# Patient Record
Sex: Female | Born: 1950 | Race: White | Hispanic: No | Marital: Married | Smoking: Former smoker
Health system: Southern US, Community
[De-identification: ages and names within clinical notes are randomized; demographics above are authoritative.]

## PROBLEM LIST (undated history)

## (undated) DIAGNOSIS — R1310 Dysphagia, unspecified: Secondary | ICD-10-CM

## (undated) DIAGNOSIS — I10 Essential (primary) hypertension: Secondary | ICD-10-CM

## (undated) DIAGNOSIS — E119 Type 2 diabetes mellitus without complications: Secondary | ICD-10-CM

## (undated) DIAGNOSIS — C3490 Malignant neoplasm of unspecified part of unspecified bronchus or lung: Secondary | ICD-10-CM

## (undated) DIAGNOSIS — G43909 Migraine, unspecified, not intractable, without status migrainosus: Secondary | ICD-10-CM

## (undated) DIAGNOSIS — E7800 Pure hypercholesterolemia, unspecified: Secondary | ICD-10-CM

## (undated) DIAGNOSIS — C801 Malignant (primary) neoplasm, unspecified: Secondary | ICD-10-CM

## (undated) DIAGNOSIS — I219 Acute myocardial infarction, unspecified: Secondary | ICD-10-CM

## (undated) HISTORY — PX: LUNG REMOVAL, PARTIAL: SHX233

## (undated) HISTORY — DX: Malignant neoplasm of unspecified part of unspecified bronchus or lung: C34.90

## (undated) HISTORY — PX: THORACOTOMY: SUR1349

## (undated) HISTORY — DX: Migraine, unspecified, not intractable, without status migrainosus: G43.909

## (undated) HISTORY — DX: Essential (primary) hypertension: I10

## (undated) HISTORY — DX: Dysphagia, unspecified: R13.10

## (undated) HISTORY — DX: Pure hypercholesterolemia, unspecified: E78.00

## (undated) HISTORY — DX: Acute myocardial infarction, unspecified: I21.9

---

## 1999-01-31 ENCOUNTER — Inpatient Hospital Stay (HOSPITAL_COMMUNITY): Admission: EM | Admit: 1999-01-31 | Discharge: 1999-02-01 | Payer: Self-pay | Admitting: Emergency Medicine

## 1999-01-31 ENCOUNTER — Encounter: Payer: Self-pay | Admitting: *Deleted

## 2000-09-18 ENCOUNTER — Ambulatory Visit (HOSPITAL_COMMUNITY): Admission: RE | Admit: 2000-09-18 | Discharge: 2000-09-18 | Payer: Self-pay | Admitting: *Deleted

## 2000-09-18 ENCOUNTER — Encounter: Payer: Self-pay | Admitting: *Deleted

## 2006-05-08 ENCOUNTER — Other Ambulatory Visit: Payer: Self-pay

## 2006-05-08 ENCOUNTER — Inpatient Hospital Stay: Payer: Self-pay | Admitting: Internal Medicine

## 2006-05-20 ENCOUNTER — Ambulatory Visit: Payer: Self-pay | Admitting: Unknown Physician Specialty

## 2006-06-30 ENCOUNTER — Ambulatory Visit: Payer: Self-pay | Admitting: Unknown Physician Specialty

## 2006-07-16 ENCOUNTER — Ambulatory Visit: Payer: Self-pay

## 2008-10-05 ENCOUNTER — Emergency Department: Payer: Self-pay | Admitting: Emergency Medicine

## 2009-02-14 ENCOUNTER — Ambulatory Visit: Payer: Self-pay | Admitting: Unknown Physician Specialty

## 2009-02-22 ENCOUNTER — Ambulatory Visit: Payer: Self-pay | Admitting: Unknown Physician Specialty

## 2009-05-29 ENCOUNTER — Ambulatory Visit: Payer: Self-pay | Admitting: Gastroenterology

## 2010-03-26 ENCOUNTER — Ambulatory Visit: Payer: Self-pay | Admitting: Unknown Physician Specialty

## 2010-05-01 ENCOUNTER — Ambulatory Visit: Payer: Self-pay | Admitting: Gastroenterology

## 2010-05-08 ENCOUNTER — Ambulatory Visit: Payer: Self-pay | Admitting: Sports Medicine

## 2010-09-01 ENCOUNTER — Ambulatory Visit: Payer: Self-pay | Admitting: Internal Medicine

## 2010-09-09 ENCOUNTER — Ambulatory Visit: Payer: Self-pay | Admitting: Unknown Physician Specialty

## 2010-09-16 ENCOUNTER — Ambulatory Visit: Payer: Self-pay | Admitting: Unknown Physician Specialty

## 2010-09-17 ENCOUNTER — Ambulatory Visit: Payer: Self-pay | Admitting: Internal Medicine

## 2010-09-20 ENCOUNTER — Ambulatory Visit: Payer: Self-pay | Admitting: Oncology

## 2010-09-25 ENCOUNTER — Ambulatory Visit: Payer: Self-pay | Admitting: Cardiothoracic Surgery

## 2010-10-02 ENCOUNTER — Ambulatory Visit: Payer: Self-pay | Admitting: Internal Medicine

## 2010-10-23 ENCOUNTER — Ambulatory Visit: Payer: Self-pay

## 2010-10-30 ENCOUNTER — Inpatient Hospital Stay: Payer: Self-pay | Admitting: Specialist

## 2010-11-02 ENCOUNTER — Ambulatory Visit: Payer: Self-pay | Admitting: Internal Medicine

## 2010-11-08 LAB — PATHOLOGY REPORT

## 2010-12-04 DIAGNOSIS — R072 Precordial pain: Secondary | ICD-10-CM

## 2010-12-19 ENCOUNTER — Ambulatory Visit: Payer: Self-pay | Admitting: Internal Medicine

## 2010-12-19 ENCOUNTER — Ambulatory Visit: Payer: Self-pay | Admitting: Oncology

## 2011-01-02 ENCOUNTER — Ambulatory Visit: Payer: Self-pay | Admitting: Internal Medicine

## 2011-02-01 ENCOUNTER — Ambulatory Visit: Payer: Self-pay | Admitting: Internal Medicine

## 2011-03-03 ENCOUNTER — Encounter: Payer: Self-pay | Admitting: Cardiothoracic Surgery

## 2011-03-04 ENCOUNTER — Ambulatory Visit: Payer: Self-pay | Admitting: Internal Medicine

## 2011-03-10 LAB — CBC CANCER CENTER
Basophil #: 0 x10 3/mm (ref 0.0–0.1)
Basophil %: 0.6 %
Eosinophil #: 0 x10 3/mm (ref 0.0–0.7)
Eosinophil %: 0.6 %
HCT: 39.5 % (ref 35.0–47.0)
HGB: 13.3 g/dL (ref 12.0–16.0)
Lymphocyte #: 2 x10 3/mm (ref 1.0–3.6)
Lymphocyte %: 62.9 %
MCH: 30.7 pg (ref 26.0–34.0)
MCHC: 33.6 g/dL (ref 32.0–36.0)
MCV: 92 fL (ref 80–100)
Monocyte #: 0.3 x10 3/mm (ref 0.0–0.7)
Monocyte %: 8 %
Neutrophil #: 0.9 x10 3/mm — ABNORMAL LOW (ref 1.4–6.5)
Neutrophil %: 27.9 %
Platelet: 122 x10 3/mm — ABNORMAL LOW (ref 150–440)
RBC: 4.31 10*6/uL (ref 3.80–5.20)
RDW: 17 % — ABNORMAL HIGH (ref 11.5–14.5)
WBC: 3.2 x10 3/mm — ABNORMAL LOW (ref 3.6–11.0)

## 2011-03-17 LAB — CBC CANCER CENTER
Basophil #: 0 x10 3/mm (ref 0.0–0.1)
Basophil %: 1 %
Eosinophil #: 0 x10 3/mm (ref 0.0–0.7)
Eosinophil %: 1.1 %
HCT: 34.4 % — ABNORMAL LOW (ref 35.0–47.0)
HGB: 11.8 g/dL — ABNORMAL LOW (ref 12.0–16.0)
Lymphocyte #: 1.9 x10 3/mm (ref 1.0–3.6)
Lymphocyte %: 46.3 %
MCH: 31.3 pg (ref 26.0–34.0)
MCHC: 34.2 g/dL (ref 32.0–36.0)
MCV: 92 fL (ref 80–100)
Monocyte #: 0.5 x10 3/mm (ref 0.0–0.7)
Monocyte %: 13.1 %
Neutrophil #: 1.6 x10 3/mm (ref 1.4–6.5)
Neutrophil %: 38.5 %
Platelet: 90 x10 3/mm — ABNORMAL LOW (ref 150–440)
RBC: 3.75 10*6/uL — ABNORMAL LOW (ref 3.80–5.20)
RDW: 17.8 % — ABNORMAL HIGH (ref 11.5–14.5)
WBC: 4.1 x10 3/mm (ref 3.6–11.0)

## 2011-03-24 LAB — COMPREHENSIVE METABOLIC PANEL
Albumin: 3.7 g/dL (ref 3.4–5.0)
Alkaline Phosphatase: 61 U/L (ref 50–136)
Anion Gap: 6 — ABNORMAL LOW (ref 7–16)
BUN: 8 mg/dL (ref 7–18)
Calcium, Total: 9.7 mg/dL (ref 8.5–10.1)
Chloride: 103 mmol/L (ref 98–107)
Creatinine: 0.66 mg/dL (ref 0.60–1.30)
EGFR (African American): >60
Glucose: 88 mg/dL (ref 65–99)
Osmolality: 273 (ref 275–301)
Potassium: 4.2 mmol/L (ref 3.5–5.1)
SGOT(AST): 17 U/L (ref 15–37)
Sodium: 138 mmol/L (ref 136–145)
Total Protein: 7.1 g/dL (ref 6.4–8.2)

## 2011-03-24 LAB — CBC CANCER CENTER
Basophil #: 0 x10 3/mm (ref 0.0–0.1)
Basophil %: 0.4 %
Eosinophil #: 0 x10 3/mm (ref 0.0–0.7)
Eosinophil %: 0.7 %
HCT: 35.4 % (ref 35.0–47.0)
Lymphocyte #: 1.8 x10 3/mm (ref 1.0–3.6)
MCH: 31.9 pg (ref 26.0–34.0)
MCHC: 34.4 g/dL (ref 32.0–36.0)
MCV: 93 fL (ref 80–100)
Monocyte #: 0.6 x10 3/mm (ref 0.0–0.7)
Neutrophil %: 31.5 %
Platelet: 222 x10 3/mm (ref 150–440)
RBC: 3.82 10*6/uL (ref 3.80–5.20)
RDW: 19.6 % — ABNORMAL HIGH (ref 11.5–14.5)
WBC: 3.6 x10 3/mm (ref 3.6–11.0)

## 2011-03-31 LAB — CBC CANCER CENTER
Basophil #: 0 x10 3/mm (ref 0.0–0.1)
Basophil %: 0.5 %
Eosinophil #: 0 x10 3/mm (ref 0.0–0.7)
HGB: 11.7 g/dL — ABNORMAL LOW (ref 12.0–16.0)
Lymphocyte %: 62 %
MCH: 31.8 pg (ref 26.0–34.0)
MCHC: 34.4 g/dL (ref 32.0–36.0)
Monocyte #: 0.3 x10 3/mm (ref 0.0–0.7)
Monocyte %: 10.2 %
Neutrophil %: 26.8 %
Platelet: 165 x10 3/mm (ref 150–440)
RBC: 3.68 10*6/uL — ABNORMAL LOW (ref 3.80–5.20)
WBC: 2.8 x10 3/mm — ABNORMAL LOW (ref 3.6–11.0)

## 2011-04-04 ENCOUNTER — Ambulatory Visit: Payer: Self-pay | Admitting: Internal Medicine

## 2011-04-07 LAB — CBC CANCER CENTER
Basophil #: 0 x10 3/mm (ref 0.0–0.1)
Eosinophil #: 0 x10 3/mm (ref 0.0–0.7)
Eosinophil %: 0.3 %
HGB: 10.7 g/dL — ABNORMAL LOW (ref 12.0–16.0)
Lymphocyte #: 1.8 x10 3/mm (ref 1.0–3.6)
MCV: 92 fL (ref 80–100)
Monocyte #: 0.5 x10 3/mm (ref 0.0–0.7)
Monocyte %: 12.5 %
Neutrophil %: 36.6 %
Platelet: 63 x10 3/mm — ABNORMAL LOW (ref 150–440)
RBC: 3.36 10*6/uL — ABNORMAL LOW (ref 3.80–5.20)
WBC: 3.7 x10 3/mm (ref 3.6–11.0)

## 2011-04-14 LAB — CBC CANCER CENTER
Basophil #: 0 x10 3/mm (ref 0.0–0.1)
Eosinophil #: 0 x10 3/mm (ref 0.0–0.7)
Eosinophil %: 0.7 %
HCT: 32.6 % — ABNORMAL LOW (ref 35.0–47.0)
Lymphocyte #: 1.6 x10 3/mm (ref 1.0–3.6)
MCH: 32.5 pg (ref 26.0–34.0)
MCHC: 34.5 g/dL (ref 32.0–36.0)
MCV: 94 fL (ref 80–100)
Monocyte #: 0.6 x10 3/mm (ref 0.0–0.7)
Neutrophil #: 1.3 x10 3/mm — ABNORMAL LOW (ref 1.4–6.5)
Platelet: 158 x10 3/mm (ref 150–440)
RDW: 22.2 % — ABNORMAL HIGH (ref 11.5–14.5)

## 2011-05-02 ENCOUNTER — Ambulatory Visit: Payer: Self-pay | Admitting: Internal Medicine

## 2011-05-20 ENCOUNTER — Ambulatory Visit: Payer: Self-pay | Admitting: Unknown Physician Specialty

## 2011-06-16 ENCOUNTER — Ambulatory Visit: Payer: Self-pay | Admitting: Oncology

## 2011-06-16 LAB — CBC CANCER CENTER
Basophil %: 1.5 %
Eosinophil #: 0.1 x10 3/mm (ref 0.0–0.7)
HCT: 39.9 % (ref 35.0–47.0)
HGB: 13.1 g/dL (ref 12.0–16.0)
Lymphocyte %: 42.2 %
MCH: 31.8 pg (ref 26.0–34.0)
MCHC: 32.9 g/dL (ref 32.0–36.0)
MCV: 97 fL (ref 80–100)
Monocyte #: 0.5 x10 3/mm (ref 0.2–0.9)
Monocyte %: 11.4 %
Neutrophil %: 42.9 %
RDW: 15.3 % — ABNORMAL HIGH (ref 11.5–14.5)

## 2011-06-16 LAB — COMPREHENSIVE METABOLIC PANEL
BUN: 8 mg/dL (ref 7–18)
Calcium, Total: 9 mg/dL (ref 8.5–10.1)
Creatinine: 0.78 mg/dL (ref 0.60–1.30)
EGFR (African American): >60
Osmolality: 274 (ref 275–301)
SGOT(AST): 18 U/L (ref 15–37)
Sodium: 138 mmol/L (ref 136–145)

## 2011-07-02 ENCOUNTER — Ambulatory Visit: Payer: Self-pay | Admitting: Oncology

## 2011-08-02 ENCOUNTER — Ambulatory Visit: Payer: Self-pay | Admitting: Oncology

## 2011-12-22 ENCOUNTER — Ambulatory Visit: Payer: Self-pay | Admitting: Oncology

## 2011-12-22 LAB — CBC CANCER CENTER
Basophil #: 0.1 x10 3/mm (ref 0.0–0.1)
Basophil %: 1 %
Eosinophil #: 0.1 x10 3/mm (ref 0.0–0.7)
Eosinophil %: 2.2 %
HCT: 41.4 % (ref 35.0–47.0)
HGB: 12.9 g/dL (ref 12.0–16.0)
Lymphocyte #: 1.7 x10 3/mm (ref 1.0–3.6)
Lymphocyte %: 32.6 %
MCH: 26.8 pg (ref 26.0–34.0)
MCHC: 31.2 g/dL — ABNORMAL LOW (ref 32.0–36.0)
MCV: 86 fL (ref 80–100)
Monocyte #: 0.6 x10 3/mm (ref 0.2–0.9)
Monocyte %: 11.4 %
Neutrophil #: 2.7 x10 3/mm (ref 1.4–6.5)
Neutrophil %: 52.8 %
Platelet: 143 x10 3/mm — ABNORMAL LOW (ref 150–440)
RBC: 4.82 10*6/uL (ref 3.80–5.20)
RDW: 16.9 % — ABNORMAL HIGH (ref 11.5–14.5)
WBC: 5.2 x10 3/mm (ref 3.6–11.0)

## 2011-12-22 LAB — BASIC METABOLIC PANEL
Anion Gap: 8 (ref 7–16)
BUN: 7 mg/dL (ref 7–18)
Calcium, Total: 9.2 mg/dL (ref 8.5–10.1)
Chloride: 101 mmol/L (ref 98–107)
Co2: 30 mmol/L (ref 21–32)
Creatinine: 0.87 mg/dL (ref 0.60–1.30)
EGFR (African American): >60
EGFR (Non-African Amer.): >60
Glucose: 88 mg/dL (ref 65–99)
Osmolality: 275 (ref 275–301)
Potassium: 4.9 mmol/L (ref 3.5–5.1)
Sodium: 139 mmol/L (ref 136–145)

## 2012-01-02 ENCOUNTER — Ambulatory Visit: Payer: Self-pay | Admitting: Oncology

## 2012-01-06 ENCOUNTER — Ambulatory Visit: Payer: Self-pay | Admitting: Oncology

## 2012-01-22 DIAGNOSIS — E785 Hyperlipidemia, unspecified: Secondary | ICD-10-CM | POA: Insufficient documentation

## 2012-01-22 DIAGNOSIS — E119 Type 2 diabetes mellitus without complications: Secondary | ICD-10-CM | POA: Insufficient documentation

## 2012-01-22 DIAGNOSIS — I10 Essential (primary) hypertension: Secondary | ICD-10-CM | POA: Insufficient documentation

## 2012-01-22 DIAGNOSIS — G2581 Restless legs syndrome: Secondary | ICD-10-CM | POA: Insufficient documentation

## 2012-01-22 DIAGNOSIS — F329 Major depressive disorder, single episode, unspecified: Secondary | ICD-10-CM | POA: Insufficient documentation

## 2012-01-22 DIAGNOSIS — G629 Polyneuropathy, unspecified: Secondary | ICD-10-CM | POA: Insufficient documentation

## 2012-03-04 DIAGNOSIS — K219 Gastro-esophageal reflux disease without esophagitis: Secondary | ICD-10-CM | POA: Insufficient documentation

## 2012-03-04 DIAGNOSIS — Z859 Personal history of malignant neoplasm, unspecified: Secondary | ICD-10-CM | POA: Insufficient documentation

## 2012-03-04 DIAGNOSIS — Z720 Tobacco use: Secondary | ICD-10-CM | POA: Insufficient documentation

## 2012-03-26 DIAGNOSIS — J449 Chronic obstructive pulmonary disease, unspecified: Secondary | ICD-10-CM | POA: Insufficient documentation

## 2012-04-03 ENCOUNTER — Ambulatory Visit: Payer: Self-pay | Admitting: Oncology

## 2012-04-19 LAB — COMPREHENSIVE METABOLIC PANEL WITH GFR
Albumin: 3.8 g/dL
Alkaline Phosphatase: 97 U/L
Anion Gap: 7
BUN: 6 mg/dL — ABNORMAL LOW
Bilirubin,Total: 0.4 mg/dL
Calcium, Total: 9.4 mg/dL
Chloride: 99 mmol/L
Co2: 30 mmol/L
Creatinine: 0.96 mg/dL
EGFR (African American): >60
EGFR (Non-African Amer.): >60
Glucose: 78 mg/dL
Osmolality: 268
Potassium: 5.1 mmol/L
SGOT(AST): 20 U/L
SGPT (ALT): 24 U/L
Sodium: 136 mmol/L
Total Protein: 8.1 g/dL

## 2012-04-19 LAB — CBC CANCER CENTER
Eosinophil #: 0.2 x10 3/mm (ref 0.0–0.7)
Eosinophil %: 3.2 %
HCT: 41.3 % (ref 35.0–47.0)
Lymphocyte %: 24.9 %
MCV: 87 fL (ref 80–100)
Neutrophil #: 3.9 x10 3/mm (ref 1.4–6.5)
Neutrophil %: 56.3 %
RBC: 4.75 10*6/uL (ref 3.80–5.20)
RDW: 18.5 % — ABNORMAL HIGH (ref 11.5–14.5)
WBC: 6.9 x10 3/mm (ref 3.6–11.0)

## 2012-05-01 ENCOUNTER — Ambulatory Visit: Payer: Self-pay | Admitting: Oncology

## 2012-05-07 DIAGNOSIS — M545 Low back pain, unspecified: Secondary | ICD-10-CM | POA: Insufficient documentation

## 2012-08-01 ENCOUNTER — Ambulatory Visit: Payer: Self-pay | Admitting: Oncology

## 2012-08-31 ENCOUNTER — Ambulatory Visit: Payer: Self-pay | Admitting: Oncology

## 2012-09-15 LAB — CBC CANCER CENTER
Basophil #: 0 x10 3/mm (ref 0.0–0.1)
Basophil %: 0.6 %
Eosinophil %: 1.4 %
HCT: 44.9 % (ref 35.0–47.0)
HGB: 15.1 g/dL (ref 12.0–16.0)
Lymphocyte #: 1.7 x10 3/mm (ref 1.0–3.6)
Lymphocyte %: 23.3 %
MCH: 30 pg (ref 26.0–34.0)
MCHC: 33.5 g/dL (ref 32.0–36.0)
MCV: 89 fL (ref 80–100)
Monocyte #: 0.7 x10 3/mm (ref 0.2–0.9)
Monocyte %: 10 %
Neutrophil #: 4.8 x10 3/mm (ref 1.4–6.5)
RBC: 5.02 10*6/uL (ref 3.80–5.20)
RDW: 16.7 % — ABNORMAL HIGH (ref 11.5–14.5)
WBC: 7.4 x10 3/mm (ref 3.6–11.0)

## 2012-09-15 LAB — COMPREHENSIVE METABOLIC PANEL
Alkaline Phosphatase: 92 U/L (ref 50–136)
Anion Gap: 3 — ABNORMAL LOW (ref 7–16)
Calcium, Total: 9.8 mg/dL (ref 8.5–10.1)
Creatinine: 0.91 mg/dL (ref 0.60–1.30)
EGFR (Non-African Amer.): >60
Glucose: 99 mg/dL (ref 65–99)
Osmolality: 274 (ref 275–301)
Potassium: 5 mmol/L (ref 3.5–5.1)
SGPT (ALT): 23 U/L (ref 12–78)
Total Protein: 7.8 g/dL (ref 6.4–8.2)

## 2012-10-01 ENCOUNTER — Ambulatory Visit: Payer: Self-pay | Admitting: Oncology

## 2012-11-22 IMAGING — CR DG CHEST 1V PORT
1 series · 1 of 1 positions shown · non-contrast
Comparison: none

REASON FOR EXAM: patient with RT sided PTX, chest tube clamped, please
re-assess RT lung field
COMMENTS:

[view not recorded]
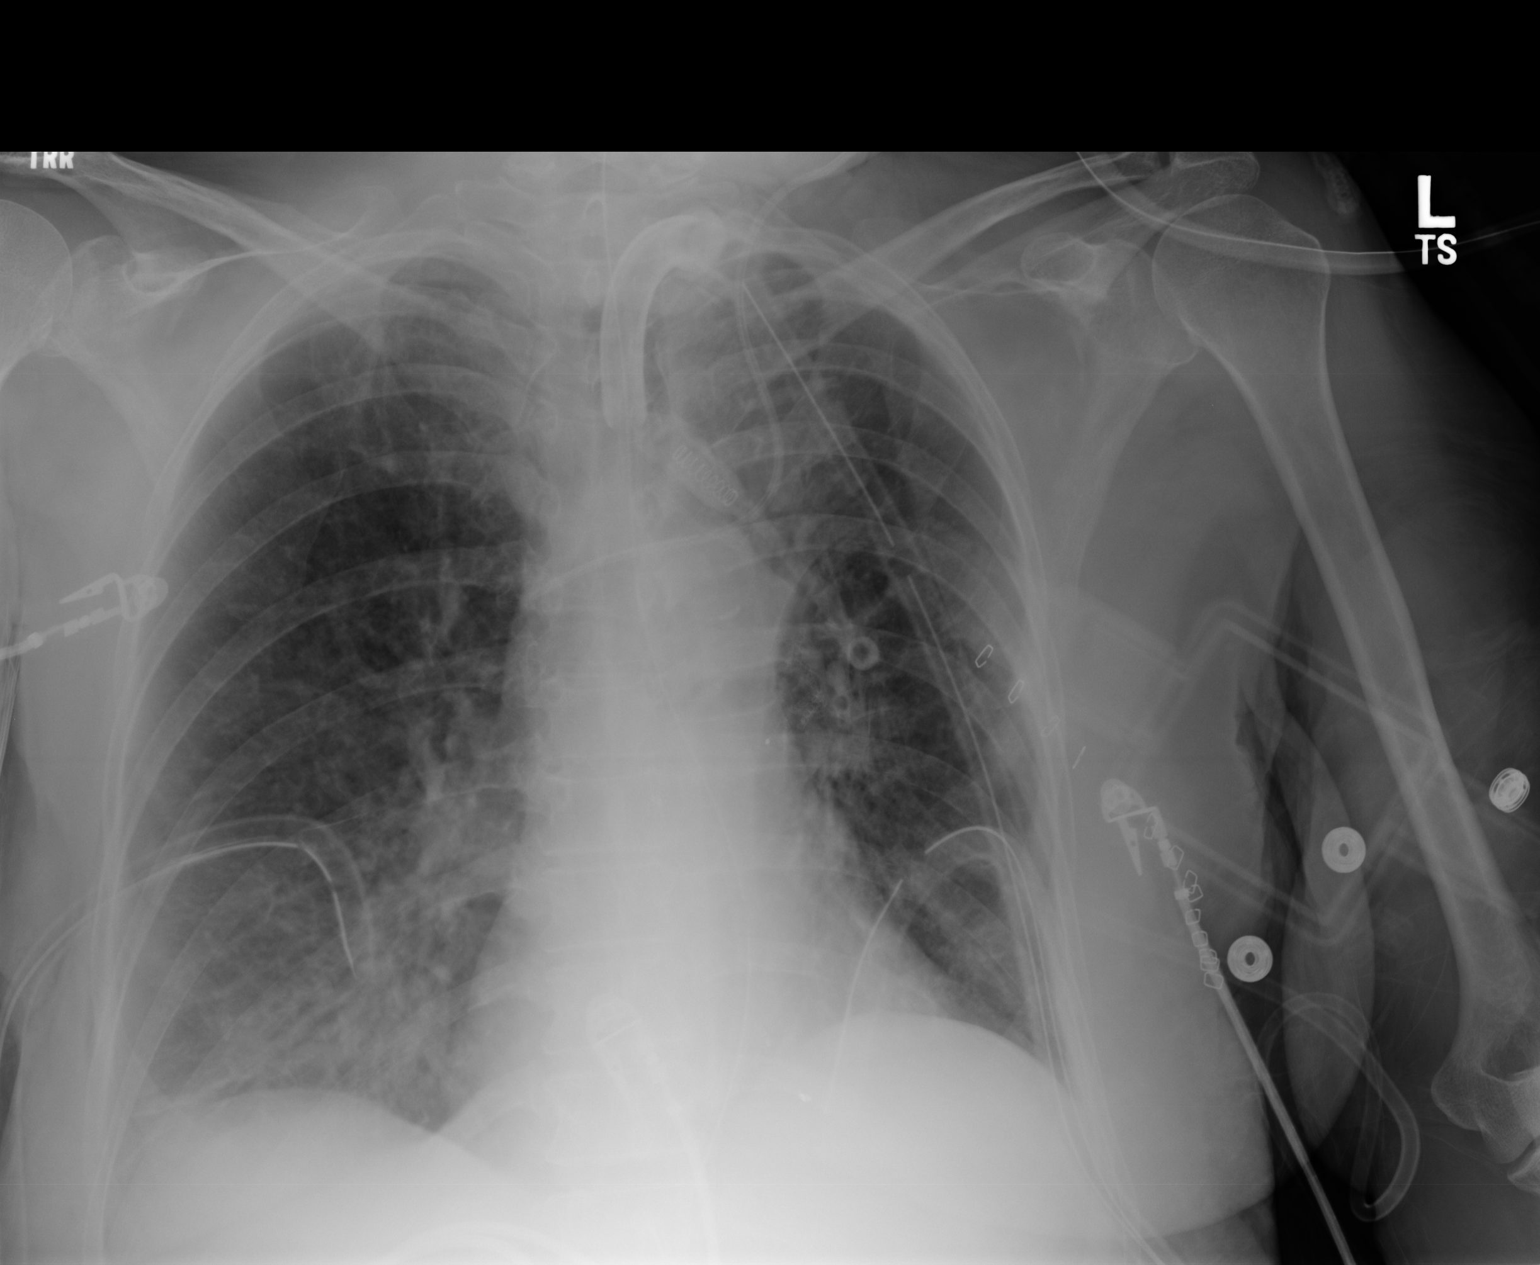

[1 of 1 positions shown; findings below may reference images not displayed]

PROCEDURE:     DXR - DXR PORTABLE CHEST SINGLE VIEW  - November 14, 2010  [DATE]

RESULT:     Comparison is made to the prior exam of 11/11/2010. A right chest
tube is present. No definite right pneumothorax is seen. No right pleural
effusion is evident.

On the left there are noted two chest tubes. No left pneumothorax is
identified. A tracheostomy is now present. Again noted is a left jugular
venous catheter with the tip projected over the upper portion of the
superior vena cava. A nasogastric tube remains present.

No new pulmonary infiltrates are seen. No pleural effusion is seen. Heart
size is normal.
IMPRESSION: 1. Bilateral chest tubes are present.
2. No pneumothorax is identified on either side.
3. No new pulmonary infiltrates are seen.
4. Support lines are again noted as mentioned above.
3. A tracheostomy is now present.

## 2012-12-21 ENCOUNTER — Ambulatory Visit: Payer: Self-pay | Admitting: Oncology

## 2012-12-21 LAB — COMPREHENSIVE METABOLIC PANEL
Anion Gap: 7 (ref 7–16)
Calcium, Total: 8.6 mg/dL (ref 8.5–10.1)
Creatinine: 1.02 mg/dL (ref 0.60–1.30)
EGFR (Non-African Amer.): 59 — ABNORMAL LOW
Glucose: 135 mg/dL — ABNORMAL HIGH (ref 65–99)
Osmolality: 273 (ref 275–301)
SGOT(AST): 18 U/L (ref 15–37)
SGPT (ALT): 30 U/L (ref 12–78)
Sodium: 136 mmol/L (ref 136–145)
Total Protein: 7.2 g/dL (ref 6.4–8.2)

## 2012-12-21 LAB — CBC CANCER CENTER
Basophil #: 0 x10 3/mm (ref 0.0–0.1)
Basophil %: 0.3 %
Eosinophil #: 0.1 x10 3/mm (ref 0.0–0.7)
HCT: 43.1 % (ref 35.0–47.0)
HGB: 14.3 g/dL (ref 12.0–16.0)
Lymphocyte %: 23.3 %
MCH: 30.2 pg (ref 26.0–34.0)
MCHC: 33.1 g/dL (ref 32.0–36.0)
Monocyte #: 0.9 x10 3/mm (ref 0.2–0.9)
Neutrophil %: 69.1 %
RBC: 4.72 10*6/uL (ref 3.80–5.20)
RDW: 15.4 % — ABNORMAL HIGH (ref 11.5–14.5)
WBC: 12.6 x10 3/mm — ABNORMAL HIGH (ref 3.6–11.0)

## 2013-01-01 ENCOUNTER — Ambulatory Visit: Payer: Self-pay | Admitting: Oncology

## 2013-06-21 ENCOUNTER — Ambulatory Visit: Payer: Self-pay | Admitting: Oncology

## 2013-06-22 LAB — COMPREHENSIVE METABOLIC PANEL
ANION GAP: 5 — AB (ref 7–16)
AST: 14 U/L — AB (ref 15–37)
Albumin: 3.7 g/dL (ref 3.4–5.0)
Alkaline Phosphatase: 81 U/L
BUN: 8 mg/dL (ref 7–18)
Bilirubin,Total: 0.3 mg/dL (ref 0.2–1.0)
CALCIUM: 9.2 mg/dL (ref 8.5–10.1)
CO2: 31 mmol/L (ref 21–32)
Chloride: 104 mmol/L (ref 98–107)
Creatinine: 0.97 mg/dL (ref 0.60–1.30)
EGFR (African American): >60
EGFR (Non-African Amer.): >60
GLUCOSE: 107 mg/dL — AB (ref 65–99)
OSMOLALITY: 278 (ref 275–301)
Potassium: 4.4 mmol/L (ref 3.5–5.1)
SGPT (ALT): 19 U/L (ref 12–78)
Sodium: 140 mmol/L (ref 136–145)
Total Protein: 7.4 g/dL (ref 6.4–8.2)

## 2013-06-22 LAB — CBC CANCER CENTER
BASOS ABS: 0.1 x10 3/mm (ref 0.0–0.1)
Basophil %: 0.7 %
Eosinophil #: 0.1 x10 3/mm (ref 0.0–0.7)
Eosinophil %: 1.5 %
HCT: 43.2 % (ref 35.0–47.0)
HGB: 14 g/dL (ref 12.0–16.0)
Lymphocyte #: 2.1 x10 3/mm (ref 1.0–3.6)
Lymphocyte %: 27.4 %
MCH: 28.9 pg (ref 26.0–34.0)
MCHC: 32.3 g/dL (ref 32.0–36.0)
MCV: 89 fL (ref 80–100)
Monocyte #: 0.8 x10 3/mm (ref 0.2–0.9)
Monocyte %: 10 %
NEUTROS ABS: 4.6 x10 3/mm (ref 1.4–6.5)
NEUTROS PCT: 60.4 %
PLATELETS: 175 x10 3/mm (ref 150–440)
RBC: 4.83 10*6/uL (ref 3.80–5.20)
RDW: 16.4 % — ABNORMAL HIGH (ref 11.5–14.5)
WBC: 7.6 x10 3/mm (ref 3.6–11.0)

## 2013-07-01 ENCOUNTER — Ambulatory Visit: Payer: Self-pay | Admitting: Oncology

## 2013-07-19 DIAGNOSIS — G4733 Obstructive sleep apnea (adult) (pediatric): Secondary | ICD-10-CM | POA: Insufficient documentation

## 2013-07-19 DIAGNOSIS — G4710 Hypersomnia, unspecified: Secondary | ICD-10-CM | POA: Insufficient documentation

## 2013-07-19 DIAGNOSIS — G4700 Insomnia, unspecified: Secondary | ICD-10-CM | POA: Insufficient documentation

## 2013-11-23 DIAGNOSIS — I739 Peripheral vascular disease, unspecified: Secondary | ICD-10-CM | POA: Insufficient documentation

## 2013-11-28 DIAGNOSIS — M79673 Pain in unspecified foot: Secondary | ICD-10-CM | POA: Insufficient documentation

## 2013-11-28 DIAGNOSIS — F419 Anxiety disorder, unspecified: Secondary | ICD-10-CM | POA: Insufficient documentation

## 2013-12-22 ENCOUNTER — Ambulatory Visit: Payer: Self-pay | Admitting: Oncology

## 2013-12-22 LAB — CBC CANCER CENTER
BASOS PCT: 0.5 %
Basophil #: 0 x10 3/mm (ref 0.0–0.1)
EOS PCT: 2.4 %
Eosinophil #: 0.2 x10 3/mm (ref 0.0–0.7)
HCT: 44.3 % (ref 35.0–47.0)
HGB: 13.9 g/dL (ref 12.0–16.0)
LYMPHS ABS: 2 x10 3/mm (ref 1.0–3.6)
LYMPHS PCT: 28 %
MCH: 28.6 pg (ref 26.0–34.0)
MCHC: 31.3 g/dL — ABNORMAL LOW (ref 32.0–36.0)
MCV: 91 fL (ref 80–100)
MONOS PCT: 9.7 %
Monocyte #: 0.7 x10 3/mm (ref 0.2–0.9)
NEUTROS PCT: 59.4 %
Neutrophil #: 4.1 x10 3/mm (ref 1.4–6.5)
Platelet: 224 x10 3/mm (ref 150–440)
RBC: 4.86 10*6/uL (ref 3.80–5.20)
RDW: 16.5 % — AB (ref 11.5–14.5)
WBC: 7 x10 3/mm (ref 3.6–11.0)

## 2013-12-22 LAB — COMPREHENSIVE METABOLIC PANEL
ALK PHOS: 73 U/L
Albumin: 3.1 g/dL — ABNORMAL LOW (ref 3.4–5.0)
Anion Gap: 7 (ref 7–16)
BUN: 10 mg/dL (ref 7–18)
Bilirubin,Total: 0.3 mg/dL (ref 0.2–1.0)
Calcium, Total: 8.7 mg/dL (ref 8.5–10.1)
Chloride: 100 mmol/L (ref 98–107)
Co2: 33 mmol/L — ABNORMAL HIGH (ref 21–32)
Creatinine: 0.99 mg/dL (ref 0.60–1.30)
Glucose: 73 mg/dL (ref 65–99)
OSMOLALITY: 277 (ref 275–301)
Potassium: 4.3 mmol/L (ref 3.5–5.1)
SGOT(AST): 11 U/L — ABNORMAL LOW (ref 15–37)
SGPT (ALT): 21 U/L
Sodium: 140 mmol/L (ref 136–145)
Total Protein: 6.8 g/dL (ref 6.4–8.2)

## 2014-01-01 ENCOUNTER — Ambulatory Visit: Payer: Self-pay | Admitting: Oncology

## 2014-06-22 ENCOUNTER — Ambulatory Visit
Admit: 2014-06-22 | Disposition: A | Discharge status: Home / Self Care | Payer: Self-pay | Attending: Oncology | Admitting: Oncology

## 2014-06-22 ENCOUNTER — Other Ambulatory Visit: Payer: Self-pay | Admitting: Oncology

## 2014-06-22 DIAGNOSIS — C3490 Malignant neoplasm of unspecified part of unspecified bronchus or lung: Secondary | ICD-10-CM

## 2014-06-22 LAB — COMPREHENSIVE METABOLIC PANEL
ALK PHOS: 63 U/L
ALT: 19 U/L
AST: 21 U/L
Albumin: 4.1 g/dL
Anion Gap: 8 (ref 7–16)
BUN: 17 mg/dL
Bilirubin,Total: 0.5 mg/dL
CHLORIDE: 97 mmol/L — AB
Calcium, Total: 9.6 mg/dL
Co2: 30 mmol/L
Creatinine: 0.79 mg/dL
EGFR (African American): >60
GLUCOSE: 64 mg/dL — AB
Potassium: 4.5 mmol/L
Sodium: 135 mmol/L
TOTAL PROTEIN: 7.6 g/dL

## 2014-06-22 LAB — CBC CANCER CENTER
BASOS PCT: 0.8 %
Basophil #: 0.1 x10 3/mm (ref 0.0–0.1)
EOS ABS: 0.1 x10 3/mm (ref 0.0–0.7)
Eosinophil %: 1 %
HCT: 39.7 % (ref 35.0–47.0)
HGB: 13.2 g/dL (ref 12.0–16.0)
LYMPHS PCT: 23.4 %
Lymphocyte #: 2.4 x10 3/mm (ref 1.0–3.6)
MCH: 29.3 pg (ref 26.0–34.0)
MCHC: 33.1 g/dL (ref 32.0–36.0)
MCV: 89 fL (ref 80–100)
Monocyte #: 0.9 x10 3/mm (ref 0.2–0.9)
Monocyte %: 8.7 %
NEUTROS PCT: 66.1 %
Neutrophil #: 6.7 x10 3/mm — ABNORMAL HIGH (ref 1.4–6.5)
Platelet: 220 x10 3/mm (ref 150–440)
RBC: 4.49 10*6/uL (ref 3.80–5.20)
RDW: 14.1 % (ref 11.5–14.5)
WBC: 10.1 x10 3/mm (ref 3.6–11.0)

## 2014-12-22 ENCOUNTER — Ambulatory Visit: Payer: Medicare Other

## 2014-12-22 ENCOUNTER — Other Ambulatory Visit: Payer: Self-pay | Admitting: *Deleted

## 2014-12-22 DIAGNOSIS — C3490 Malignant neoplasm of unspecified part of unspecified bronchus or lung: Secondary | ICD-10-CM

## 2014-12-25 ENCOUNTER — Other Ambulatory Visit: Payer: Self-pay

## 2014-12-25 ENCOUNTER — Ambulatory Visit
Admission: RE | Admit: 2014-12-25 | Discharge: 2014-12-25 | Disposition: A | Discharge status: Home / Self Care | Payer: Medicare Other | Source: Ambulatory Visit | Attending: Oncology | Admitting: Oncology

## 2014-12-25 ENCOUNTER — Ambulatory Visit: Payer: Self-pay | Admitting: Oncology

## 2014-12-25 ENCOUNTER — Inpatient Hospital Stay: Payer: Medicare Other | Attending: Oncology | Admitting: Oncology

## 2014-12-25 ENCOUNTER — Inpatient Hospital Stay: Payer: Medicare Other

## 2014-12-25 ENCOUNTER — Encounter: Payer: Self-pay | Admitting: Oncology

## 2014-12-25 VITALS — BP 141/93 | HR 123 | Temp 97.6°F | Resp 19 | Wt 121.9 lb

## 2014-12-25 DIAGNOSIS — I10 Essential (primary) hypertension: Secondary | ICD-10-CM | POA: Diagnosis not present

## 2014-12-25 DIAGNOSIS — C3490 Malignant neoplasm of unspecified part of unspecified bronchus or lung: Secondary | ICD-10-CM

## 2014-12-25 DIAGNOSIS — Z09 Encounter for follow-up examination after completed treatment for conditions other than malignant neoplasm: Secondary | ICD-10-CM | POA: Insufficient documentation

## 2014-12-25 DIAGNOSIS — Z85118 Personal history of other malignant neoplasm of bronchus and lung: Secondary | ICD-10-CM | POA: Insufficient documentation

## 2014-12-25 DIAGNOSIS — I7090 Unspecified atherosclerosis: Secondary | ICD-10-CM | POA: Insufficient documentation

## 2014-12-25 DIAGNOSIS — R5383 Other fatigue: Secondary | ICD-10-CM

## 2014-12-25 DIAGNOSIS — E7800 Pure hypercholesterolemia, unspecified: Secondary | ICD-10-CM | POA: Diagnosis not present

## 2014-12-25 DIAGNOSIS — I2510 Atherosclerotic heart disease of native coronary artery without angina pectoris: Secondary | ICD-10-CM | POA: Insufficient documentation

## 2014-12-25 DIAGNOSIS — R918 Other nonspecific abnormal finding of lung field: Secondary | ICD-10-CM | POA: Diagnosis not present

## 2014-12-25 DIAGNOSIS — R911 Solitary pulmonary nodule: Secondary | ICD-10-CM | POA: Insufficient documentation

## 2014-12-25 DIAGNOSIS — F17210 Nicotine dependence, cigarettes, uncomplicated: Secondary | ICD-10-CM | POA: Diagnosis not present

## 2014-12-25 DIAGNOSIS — R1310 Dysphagia, unspecified: Secondary | ICD-10-CM | POA: Insufficient documentation

## 2014-12-25 DIAGNOSIS — E119 Type 2 diabetes mellitus without complications: Secondary | ICD-10-CM

## 2014-12-25 DIAGNOSIS — R05 Cough: Secondary | ICD-10-CM | POA: Insufficient documentation

## 2014-12-25 DIAGNOSIS — J449 Chronic obstructive pulmonary disease, unspecified: Secondary | ICD-10-CM | POA: Diagnosis not present

## 2014-12-25 DIAGNOSIS — J439 Emphysema, unspecified: Secondary | ICD-10-CM | POA: Diagnosis not present

## 2014-12-25 DIAGNOSIS — Z9889 Other specified postprocedural states: Secondary | ICD-10-CM | POA: Diagnosis not present

## 2014-12-25 DIAGNOSIS — Z8669 Personal history of other diseases of the nervous system and sense organs: Secondary | ICD-10-CM | POA: Diagnosis not present

## 2014-12-25 DIAGNOSIS — Z23 Encounter for immunization: Secondary | ICD-10-CM | POA: Diagnosis not present

## 2014-12-25 DIAGNOSIS — I252 Old myocardial infarction: Secondary | ICD-10-CM | POA: Diagnosis not present

## 2014-12-25 DIAGNOSIS — Z9221 Personal history of antineoplastic chemotherapy: Secondary | ICD-10-CM | POA: Diagnosis not present

## 2014-12-25 HISTORY — DX: Malignant (primary) neoplasm, unspecified: C80.1

## 2014-12-25 HISTORY — DX: Type 2 diabetes mellitus without complications: E11.9

## 2014-12-25 LAB — COMPREHENSIVE METABOLIC PANEL
ALBUMIN: 3.9 g/dL (ref 3.5–5.0)
ALT: 14 U/L (ref 14–54)
AST: 21 U/L (ref 15–41)
Alkaline Phosphatase: 66 U/L (ref 38–126)
Anion gap: 7 (ref 5–15)
BILIRUBIN TOTAL: 0.4 mg/dL (ref 0.3–1.2)
BUN: 9 mg/dL (ref 6–20)
CO2: 34 mmol/L — AB (ref 22–32)
Calcium: 9 mg/dL (ref 8.9–10.3)
Chloride: 95 mmol/L — ABNORMAL LOW (ref 101–111)
Creatinine, Ser: 0.97 mg/dL (ref 0.44–1.00)
GFR calc Af Amer: >60 mL/min (ref >60)
GFR calc non Af Amer: >60 mL/min (ref >60)
GLUCOSE: 70 mg/dL (ref 65–99)
Potassium: 3.8 mmol/L (ref 3.5–5.1)
Sodium: 136 mmol/L (ref 135–145)
TOTAL PROTEIN: 7.2 g/dL (ref 6.5–8.1)

## 2014-12-25 LAB — CBC WITH DIFFERENTIAL/PLATELET
BASOS ABS: 0 10*3/uL (ref 0–0.1)
BASOS PCT: 1 %
Eosinophils Absolute: 0.1 10*3/uL (ref 0–0.7)
Eosinophils Relative: 1 %
HEMATOCRIT: 44 % (ref 35.0–47.0)
HEMOGLOBIN: 14.3 g/dL (ref 12.0–16.0)
Lymphocytes Relative: 28 %
Lymphs Abs: 1.6 10*3/uL (ref 1.0–3.6)
MCH: 26.7 pg (ref 26.0–34.0)
MCHC: 32.5 g/dL (ref 32.0–36.0)
MCV: 82.2 fL (ref 80.0–100.0)
MONO ABS: 0.7 10*3/uL (ref 0.2–0.9)
Monocytes Relative: 13 %
NEUTROS ABS: 3.2 10*3/uL (ref 1.4–6.5)
NEUTROS PCT: 57 %
Platelets: 197 10*3/uL (ref 150–440)
RBC: 5.35 MIL/uL — ABNORMAL HIGH (ref 3.80–5.20)
RDW: 17.4 % — AB (ref 11.5–14.5)
WBC: 5.6 10*3/uL (ref 3.6–11.0)

## 2014-12-25 LAB — POCT I-STAT CREATININE: CREATININE: 1 mg/dL (ref 0.44–1.00)

## 2014-12-25 MED ORDER — IOHEXOL 300 MG/ML  SOLN
75.0000 mL | Freq: Once | INTRAMUSCULAR | Status: AC | PRN
  Administered 2014-12-25: 75 mL via INTRAVENOUS

## 2014-12-25 NOTE — Progress Notes (Signed)
Pt states she feels like something is "blocking" her from passing stool, only able to pass "small pieces" pass. Recently with weather change has had productive cough with sinus drainage, cough produces white to green sputum.

## 2014-12-25 NOTE — Progress Notes (Signed)
[place] @ [facility] [phone]  Fax:(336) [place]: October 04, 1950  MR#: [id number]  KRC#:[id number]  Patient Care Team: Pcp Not In System as PCP - General  CHIEF COMPLAINT:  Chief Complaint  Patient presents with  . Cough    productive cough    Chief Complaint/Diagnosis:   1. 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 Adjuvant chemotherapy with carboplatinum and Alimta started in November of 2012    INTERVAL HISTORY:  64 year old lady came today further follow-up regarding carcinoma of lung.  Patient continues to smoke.  Is dry hacking cough.  Using inhalers.  No hemoptysis no chest pain.  Appetite has been stable.  Here for further follow-up and treatment consideration This and also had a CT scan of chest which has been reviewed independently and reviewed with the patient. REVIEW OF SYSTEMS:    general status: Patient is feeling weak and tired.  No change in a performance status.  No chills.  No fever. HEENT: No evidence of stomatitis Lungs: Dry hacking cough.,  Shortness of breath on exertion.  Patient continues to smoke. Cardiac: No chest pain or paroxysmal nocturnal dyspnea GI: No nausea no vomiting no diarrhea no abdominal pain Skin: No rash Lower extremity no swelling Neurological system: No tingling.  No numbness.  No other focal signs Musculoskeletal system no bony pains  As per HPI. Otherwise, a complete review of systems is negatve.  PAST MEDICAL HISTORY: Past Medical History  Diagnosis Date  . Cancer ([place])   . Diabetes mellitus without complication ([place])     Significant History/PMH:   Dysphagia: this admission   hypertension:    DDD:    Diabetes Mellitus, Type II (NIDD):    lung cancer:    Migraines:    Hypertension:    MI - Myocardial Infarct:   Preventive Screening:  Has patient had any of the following test? Colonscopy   Mammography   Last Colonoscopy: 05/2009 colonoscopy;   Last Mammography: 2012   Smoking History: Smoking History 1 Packs per day.  PFSH: Comments: history of hypertension, anemia, liver diseaseNo family history of colorectal cancer, breast cancer, or ovarian cancer.  Comments: smokes one pack per day for last 55 years or more.  Does not drink.  Does not use any drugs  Additional Past Medical and Surgical History: peripheral neuropathy.  Diabetes non-insulin-dependent.  Hypercholesterolemia.  Gastric ulcer on endoscopy.  Degenerative arthritis   HEALTH MAINTENANCE: Social History  Substance Use Topics  . Smoking status: Not on file  . Smokeless tobacco: Not on file  . Alcohol Use: Not on file        OBJECTIVE:  Filed Vitals:   12/25/14 1427  BP: 141/93  Pulse: 123  Temp: 97.6 F (36.4 C)  Resp: 19     There is no height on file to calculate BMI.    ECOG FS:1 - Symptomatic but completely ambulatory  PHYSICAL EXAM: GENERAL:  Well developed, well nourished, sitting comfortably in the exam room in no acute distress. MENTAL STATUS:  Alert and oriented to person, place and time.  ENT:  Oropharynx clear without lesion.  Tongue normal. Mucous membranes moist.  RESPIRATORY:  Clear to auscultation without rales, wheezes or rhonchi. CARDIOVASCULAR:  Regular rate and rhythm without murmur, rub or gallop. BREAST:  Right breast without masses, skin changes or nipple discharge.  Left breast without masses, skin  changes or nipple discharge. ABDOMEN:  Soft, non-tender, with active bowel sounds, and no hepatosplenomegaly.  No masses. BACK:  No CVA tenderness.  No tenderness on percussion of the back or rib cage. SKIN:  No rashes, ulcers or lesions. EXTREMITIES: No edema, no skin discoloration or tenderness.  No palpable cords. LYMPH NODES: No palpable cervical, supraclavicular, axillary or inguinal adenopathy  NEUROLOGICAL: Unremarkable. PSYCH:  Appropriate.   LAB RESULTS:  CBC  Latest Ref Rng 12/25/2014 06/22/2014  WBC 3.6 - 11.0 K/uL 5.6 10.1  Hemoglobin 12.0 - 16.0 g/dL 14.3 13.2  Hematocrit 35.0 - 47.0 % 44.0 39.7  Platelets 150 - 440 K/uL 197 220    Appointment on 12/25/2014  Component Date Value Ref Range Status  . WBC 12/25/2014 5.6  3.6 - 11.0 K/uL Final  . RBC 12/25/2014 5.35* 3.80 - 5.20 MIL/uL Final  . Hemoglobin 12/25/2014 14.3  12.0 - 16.0 g/dL Final  . HCT 12/25/2014 44.0  35.0 - 47.0 % Final  . MCV 12/25/2014 82.2  80.0 - 100.0 fL Final  . MCH 12/25/2014 26.7  26.0 - 34.0 pg Final  . MCHC 12/25/2014 32.5  32.0 - 36.0 g/dL Final  . RDW 12/25/2014 17.4* 11.5 - 14.5 % Final  . Platelets 12/25/2014 197  150 - 440 K/uL Final  . Neutrophils Relative % 12/25/2014 57   Final  . Neutro Abs 12/25/2014 3.2  1.4 - 6.5 K/uL Final  . Lymphocytes Relative 12/25/2014 28   Final  . Lymphs Abs 12/25/2014 1.6  1.0 - 3.6 K/uL Final  . Monocytes Relative 12/25/2014 13   Final  . Monocytes Absolute 12/25/2014 0.7  0.2 - 0.9 K/uL Final  . Eosinophils Relative 12/25/2014 1   Final  . Eosinophils Absolute 12/25/2014 0.1  0 - 0.7 K/uL Final  . Basophils Relative 12/25/2014 1   Final  . Basophils Absolute 12/25/2014 0.0  0 - 0.1 K/uL Final  . Sodium 12/25/2014 136  135 - 145 mmol/L Final  . Potassium 12/25/2014 3.8  3.5 - 5.1 mmol/L Final  . Chloride 12/25/2014 95* 101 - 111 mmol/L Final  . CO2 12/25/2014 34* 22 - 32 mmol/L Final  . Glucose, Bld 12/25/2014 70  65 - 99 mg/dL Final  . BUN 12/25/2014 9  6 - 20 mg/dL Final  . Creatinine, Ser 12/25/2014 0.97  0.44 - 1.00 mg/dL Final  . Calcium 12/25/2014 9.0  8.9 - 10.3 mg/dL Final  . Total Protein 12/25/2014 7.2  6.5 - 8.1 g/dL Final  . Albumin 12/25/2014 3.9  3.5 - 5.0 g/dL Final  . AST 12/25/2014 21  15 - 41 U/L Final  . ALT 12/25/2014 14  14 - 54 U/L Final  . Alkaline Phosphatase 12/25/2014 66  38 - 126 U/L Final  . Total Bilirubin 12/25/2014 0.4  0.3 - 1.2 mg/dL Final  . GFR calc non Af Amer 12/25/2014 >60  >60  mL/min Final  . GFR calc Af Amer 12/25/2014 >60  >60 mL/min Final   Comment: (NOTE) The eGFR has been calculated using the CKD EPI equation. This calculation has not been validated in all clinical situations. eGFR's persistently <60 mL/min signify possible Chronic Kidney Disease.   Georgiann Hahn gap 12/25/2014 7  5 - 15 Final  Hospital Outpatient Visit on 12/25/2014  Component Date Value Ref Range Status  . Creatinine, Ser 12/25/2014 1.00  0.44 - 1.00 mg/dL Final    IMPRESSION: 1. New 5 mm left lower lobe pulmonary nodule, for which a metastasis or  new primary bronchogenic carcinoma cannot be excluded. Recommend follow-up chest CT in 3 months. 2. Otherwise no potential sites of metastatic disease in the chest status post left upper lobectomy. 3. Moderate centrilobular and paraseptal emphysema with diffuse bronchial wall thickening, suggesting COPD. 4. Atherosclerosis, including two-vessel coronary artery disease. Please note that although the presence of coronary artery calcium documents the presence of coronary artery disease, the severity of this disease and any potential stenosis cannot be assessed on this non-gated CT examination.   Electronically Signed  By: Ilona Sorrel M.D.  On: 12/25/2014 16:15   ASSESSMENT: Carcinoma of lung stage II status post resection and adjuvant chemotherapy CT scan of the chest is been reviewed revealed new 5 mm left lower lobe nodule which needs to be observed another CT scan would be ordered in 3-6 month for evaluation CT scan has been reviewed dependently in reviewed with the patient. COPD Chronic smoking Continue follow-up CT scan without contrast will be scheduled prior to next appointment   Patient expressed understanding and was in agreement with this plan. She also understands that She can call clinic at any time with any questions, concerns, or complaints.    No matching staging information was found for the patient.  Forest Gleason, MD   12/25/2014 3:19 PM

## 2014-12-26 MED ORDER — INFLUENZA VAC SPLIT QUAD 0.5 ML IM SUSY
0.5000 mL | PREFILLED_SYRINGE | Freq: Once | INTRAMUSCULAR | Status: AC
  Administered 2014-12-25: 0.5 mL via INTRAMUSCULAR

## 2015-01-05 ENCOUNTER — Encounter: Payer: Self-pay | Admitting: Oncology

## 2015-01-08 ENCOUNTER — Other Ambulatory Visit: Payer: Self-pay | Admitting: *Deleted

## 2015-01-08 DIAGNOSIS — R911 Solitary pulmonary nodule: Secondary | ICD-10-CM

## 2015-01-08 DIAGNOSIS — Z85118 Personal history of other malignant neoplasm of bronchus and lung: Secondary | ICD-10-CM

## 2015-02-12 DIAGNOSIS — IMO0001 Reserved for inherently not codable concepts without codable children: Secondary | ICD-10-CM | POA: Insufficient documentation

## 2015-02-14 DIAGNOSIS — Z9114 Patient's other noncompliance with medication regimen: Secondary | ICD-10-CM | POA: Insufficient documentation

## 2015-06-04 DIAGNOSIS — M7060 Trochanteric bursitis, unspecified hip: Secondary | ICD-10-CM | POA: Insufficient documentation

## 2015-06-22 ENCOUNTER — Ambulatory Visit: Payer: Medicare Other

## 2015-06-25 ENCOUNTER — Other Ambulatory Visit: Payer: Medicare Other

## 2015-06-25 ENCOUNTER — Ambulatory Visit: Payer: Medicare Other | Admitting: Oncology

## 2015-07-18 ENCOUNTER — Inpatient Hospital Stay: Payer: Medicare Other | Attending: Oncology

## 2015-07-18 ENCOUNTER — Encounter: Payer: Self-pay | Admitting: Oncology

## 2015-07-18 ENCOUNTER — Inpatient Hospital Stay (HOSPITAL_BASED_OUTPATIENT_CLINIC_OR_DEPARTMENT_OTHER): Payer: Medicare Other | Admitting: Oncology

## 2015-07-18 ENCOUNTER — Ambulatory Visit
Admission: RE | Admit: 2015-07-18 | Discharge: 2015-07-18 | Disposition: A | Discharge status: Home / Self Care | Payer: Medicare Other | Source: Ambulatory Visit | Attending: Oncology | Admitting: Oncology

## 2015-07-18 VITALS — BP 150/88 | HR 96 | Temp 96.2°F | Resp 18 | Wt 109.8 lb

## 2015-07-18 DIAGNOSIS — R531 Weakness: Secondary | ICD-10-CM | POA: Insufficient documentation

## 2015-07-18 DIAGNOSIS — E119 Type 2 diabetes mellitus without complications: Secondary | ICD-10-CM | POA: Insufficient documentation

## 2015-07-18 DIAGNOSIS — M1990 Unspecified osteoarthritis, unspecified site: Secondary | ICD-10-CM | POA: Insufficient documentation

## 2015-07-18 DIAGNOSIS — R5383 Other fatigue: Secondary | ICD-10-CM | POA: Diagnosis not present

## 2015-07-18 DIAGNOSIS — Z9221 Personal history of antineoplastic chemotherapy: Secondary | ICD-10-CM

## 2015-07-18 DIAGNOSIS — E7800 Pure hypercholesterolemia, unspecified: Secondary | ICD-10-CM | POA: Insufficient documentation

## 2015-07-18 DIAGNOSIS — F17210 Nicotine dependence, cigarettes, uncomplicated: Secondary | ICD-10-CM | POA: Insufficient documentation

## 2015-07-18 DIAGNOSIS — I252 Old myocardial infarction: Secondary | ICD-10-CM | POA: Insufficient documentation

## 2015-07-18 DIAGNOSIS — I2510 Atherosclerotic heart disease of native coronary artery without angina pectoris: Secondary | ICD-10-CM

## 2015-07-18 DIAGNOSIS — C3492 Malignant neoplasm of unspecified part of left bronchus or lung: Secondary | ICD-10-CM | POA: Insufficient documentation

## 2015-07-18 DIAGNOSIS — Z8711 Personal history of peptic ulcer disease: Secondary | ICD-10-CM | POA: Insufficient documentation

## 2015-07-18 DIAGNOSIS — C3490 Malignant neoplasm of unspecified part of unspecified bronchus or lung: Secondary | ICD-10-CM

## 2015-07-18 DIAGNOSIS — Z85118 Personal history of other malignant neoplasm of bronchus and lung: Secondary | ICD-10-CM | POA: Diagnosis present

## 2015-07-18 DIAGNOSIS — J439 Emphysema, unspecified: Secondary | ICD-10-CM | POA: Insufficient documentation

## 2015-07-18 DIAGNOSIS — I10 Essential (primary) hypertension: Secondary | ICD-10-CM

## 2015-07-18 DIAGNOSIS — R110 Nausea: Secondary | ICD-10-CM

## 2015-07-18 DIAGNOSIS — R911 Solitary pulmonary nodule: Secondary | ICD-10-CM | POA: Diagnosis present

## 2015-07-18 DIAGNOSIS — Z902 Acquired absence of lung [part of]: Secondary | ICD-10-CM | POA: Insufficient documentation

## 2015-07-18 DIAGNOSIS — G629 Polyneuropathy, unspecified: Secondary | ICD-10-CM

## 2015-07-18 DIAGNOSIS — R1310 Dysphagia, unspecified: Secondary | ICD-10-CM | POA: Insufficient documentation

## 2015-07-18 DIAGNOSIS — Z8669 Personal history of other diseases of the nervous system and sense organs: Secondary | ICD-10-CM

## 2015-07-18 LAB — COMPREHENSIVE METABOLIC PANEL
ALT: 17 U/L (ref 14–54)
ANION GAP: 5 (ref 5–15)
AST: 19 U/L (ref 15–41)
Albumin: 4.3 g/dL (ref 3.5–5.0)
Alkaline Phosphatase: 65 U/L (ref 38–126)
BUN: 13 mg/dL (ref 6–20)
CHLORIDE: 96 mmol/L — AB (ref 101–111)
CO2: 33 mmol/L — AB (ref 22–32)
CREATININE: 0.83 mg/dL (ref 0.44–1.00)
Calcium: 9.2 mg/dL (ref 8.9–10.3)
Glucose, Bld: 84 mg/dL (ref 65–99)
POTASSIUM: 4.5 mmol/L (ref 3.5–5.1)
SODIUM: 134 mmol/L — AB (ref 135–145)
Total Bilirubin: 0.3 mg/dL (ref 0.3–1.2)
Total Protein: 7.5 g/dL (ref 6.5–8.1)

## 2015-07-18 LAB — CBC WITH DIFFERENTIAL/PLATELET
Basophils Absolute: 0 10*3/uL (ref 0–0.1)
Basophils Relative: 1 %
EOS ABS: 0 10*3/uL (ref 0–0.7)
EOS PCT: 1 %
HCT: 41.8 % (ref 35.0–47.0)
Hemoglobin: 13.9 g/dL (ref 12.0–16.0)
LYMPHS ABS: 1.4 10*3/uL (ref 1.0–3.6)
LYMPHS PCT: 23 %
MCH: 27.5 pg (ref 26.0–34.0)
MCHC: 33.2 g/dL (ref 32.0–36.0)
MCV: 82.7 fL (ref 80.0–100.0)
MONO ABS: 0.6 10*3/uL (ref 0.2–0.9)
MONOS PCT: 9 %
Neutro Abs: 4.1 10*3/uL (ref 1.4–6.5)
Neutrophils Relative %: 66 %
PLATELETS: 181 10*3/uL (ref 150–440)
RBC: 5.06 MIL/uL (ref 3.80–5.20)
RDW: 17.7 % — AB (ref 11.5–14.5)
WBC: 6.2 10*3/uL (ref 3.6–11.0)

## 2015-07-18 MED ORDER — AZITHROMYCIN 500 MG PO TABS: 500.0000 mg | ORAL_TABLET | Freq: Every day | ORAL | Status: DC

## 2015-07-18 NOTE — Progress Notes (Signed)
Patient states she is here for CT results.  Also c/o being nauseated all the time.  Does not have antiemetic.

## 2015-07-21 ENCOUNTER — Encounter: Payer: Self-pay | Admitting: Oncology

## 2015-07-21 NOTE — Progress Notes (Signed)
[place] @ [facility] Telephone:([phone]  Fax:(336) [place]: 1950/07/16  MR#: [id number]  GYK#:[id number]  Patient Care Team: Lazaro Arms, MD as PCP - General (Family Medicine)  CHIEF COMPLAINT:  Chief Complaint  Patient presents with  . Lung Cancer   Chief Complaint/Diagnosis:   1. 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 Adjuvant chemotherapy with carboplatinum and Alimta started in November of 2012    INTERVAL HISTORY:  65 year old lady came today further follow-up regarding carcinoma of lung.  Patient continues to smoke.  Is dry hacking cough.  Using inhalers.  No hemoptysis no chest pain.  Appetite has been stable.  Here for further follow-up and treatment consideration This and also had a CT scan of chest which has been reviewed independently and reviewed with the patient..      Patient states she is here for CT results. Also c/o being nauseated all the time. Does     REVIEW OF SYSTEMS:    general status: Patient is feeling weak and tired.  No change in a performance status.  No chills.  No fever. HEENT: No evidence of stomatitis Lungs: Dry hacking cough.,  Shortness of breath on exertion.  Patient continues to smoke.  Had a repeat CT screening  Cardiac: No chest pain or paroxysmal nocturnal dyspnea GI: Complains of mild nausea off and on Skin: No rash Lower extremity no swelling Neurological system: No tingling.  No numbness.  No other focal signs Musculoskeletal system no bony pains  As per HPI. Otherwise, a complete review of systems is negatve.  PAST MEDICAL HISTORY: Past Medical History  Diagnosis Date  . Cancer ([place])   . Diabetes mellitus without complication ([place])   . Lung cancer ([place])   . Myocardial infarction ([place])   . Hypertension   . Migraine   . Dysphagia   . Hypercholesteremia     Significant History/PMH:   Dysphagia:  this admission   hypertension:    DDD:    Diabetes Mellitus, Type II (NIDD):    lung cancer:    Migraines:    Hypertension:    MI - Myocardial Infarct:   Preventive Screening:  Has patient had any of the following test? Colonscopy  Mammography   Last Colonoscopy: 05/2009 colonoscopy;   Last Mammography: 2012   Smoking History: Smoking History 1 Packs per day.  PFSH: Comments: history of hypertension, anemia, liver diseaseNo family history of colorectal cancer, breast cancer, or ovarian cancer.  Comments: smokes one pack per day for last 55 years or more.  Does not drink.  Does not use any drugs  Additional Past Medical and Surgical History: peripheral neuropathy.  Diabetes non-insulin-dependent.  Hypercholesterolemia.  Gastric ulcer on endoscopy.  Degenerative arthritis   HEALTH MAINTENANCE: Social History  Substance Use Topics  . Smoking status: Former Smoker    Types: Cigarettes    Quit date: 02/01/2012  . Smokeless tobacco: None  . Alcohol Use: None        OBJECTIVE:  Filed Vitals:   07/18/15 1156  BP: 150/88  Pulse: 96  Temp: 96.2 F (35.7 C)  Resp: 18     There is no height on file to calculate BMI.    ECOG FS:1 - Symptomatic but completely ambulatory  PHYSICAL EXAM: GENERAL:  Well developed, well nourished, sitting comfortably in the exam room in no acute distress. MENTAL STATUS:  Alert and oriented to person, place and time.  ENT:  Oropharynx clear without lesion.  Tongue normal. Mucous membranes moist.  RESPIRATORY:  Clear to auscultation without rales, wheezes or rhonchi. CARDIOVASCULAR:  Regular rate and rhythm without murmur, rub or gallop. BREAST:  Right breast without masses, skin changes or nipple discharge.  Left breast without masses, skin changes or nipple discharge. ABDOMEN:  Soft, non-tender, with active bowel sounds, and no hepatosplenomegaly.  No masses. BACK:  No CVA tenderness.  No tenderness on percussion of the back or rib  cage. SKIN:  No rashes, ulcers or lesions. EXTREMITIES: No edema, no skin discoloration or tenderness.  No palpable cords. LYMPH NODES: No palpable cervical, supraclavicular, axillary or inguinal adenopathy  NEUROLOGICAL: Unremarkable. PSYCH:  Appropriate.   LAB RESULTS:  CBC Latest Ref Rng 07/18/2015 12/25/2014  WBC 3.6 - 11.0 K/uL 6.2 5.6  Hemoglobin 12.0 - 16.0 g/dL 13.9 14.3  Hematocrit 35.0 - 47.0 % 41.8 44.0  Platelets 150 - 440 K/uL 181 197    Appointment on 07/18/2015  Component Date Value Ref Range Status  . WBC 07/18/2015 6.2  3.6 - 11.0 K/uL Final  . RBC 07/18/2015 5.06  3.80 - 5.20 MIL/uL Final  . Hemoglobin 07/18/2015 13.9  12.0 - 16.0 g/dL Final  . HCT 07/18/2015 41.8  35.0 - 47.0 % Final  . MCV 07/18/2015 82.7  80.0 - 100.0 fL Final  . MCH 07/18/2015 27.5  26.0 - 34.0 pg Final  . MCHC 07/18/2015 33.2  32.0 - 36.0 g/dL Final  . RDW 07/18/2015 17.7* 11.5 - 14.5 % Final  . Platelets 07/18/2015 181  150 - 440 K/uL Final  . Neutrophils Relative % 07/18/2015 66   Final  . Neutro Abs 07/18/2015 4.1  1.4 - 6.5 K/uL Final  . Lymphocytes Relative 07/18/2015 23   Final  . Lymphs Abs 07/18/2015 1.4  1.0 - 3.6 K/uL Final  . Monocytes Relative 07/18/2015 9   Final  . Monocytes Absolute 07/18/2015 0.6  0.2 - 0.9 K/uL Final  . Eosinophils Relative 07/18/2015 1   Final  . Eosinophils Absolute 07/18/2015 0.0  0 - 0.7 K/uL Final  . Basophils Relative 07/18/2015 1   Final  . Basophils Absolute 07/18/2015 0.0  0 - 0.1 K/uL Final  . Sodium 07/18/2015 134* 135 - 145 mmol/L Final  . Potassium 07/18/2015 4.5  3.5 - 5.1 mmol/L Final  . Chloride 07/18/2015 96* 101 - 111 mmol/L Final  . CO2 07/18/2015 33* 22 - 32 mmol/L Final  . Glucose, Bld 07/18/2015 84  65 - 99 mg/dL Final  . BUN 07/18/2015 13  6 - 20 mg/dL Final  . Creatinine, Ser 07/18/2015 0.83  0.44 - 1.00 mg/dL Final  . Calcium 07/18/2015 9.2  8.9 - 10.3 mg/dL Final  . Total Protein 07/18/2015 7.5  6.5 - 8.1 g/dL Final  .  Albumin 07/18/2015 4.3  3.5 - 5.0 g/dL Final  . AST 07/18/2015 19  15 - 41 U/L Final  . ALT 07/18/2015 17  14 - 54 U/L Final  . Alkaline Phosphatase 07/18/2015 65  38 - 126 U/L Final  . Total Bilirubin 07/18/2015 0.3  0.3 - 1.2 mg/dL Final  . GFR calc non Af Amer 07/18/2015 >60  >60 mL/min Final  . GFR calc Af Amer 07/18/2015 >60  >60 mL/min Final   Comment: (NOTE) The eGFR has been calculated using the CKD EPI equation. This calculation has not been validated in all clinical situations. eGFR's persistently <60 mL/min   signify possible Chronic Kidney Disease.   . Anion gap 07/18/2015 5  5 - 15 Final   IMPRESSION: 1. No evidence of metastatic disease in the chest. Previously described 5 mm left lower lobe pulmonary nodule has resolved. 2. Status post left upper lobectomy. No evidence of local tumor recurrence. 3. Three-vessel coronary atherosclerosis. 4. Moderate emphysema diffuse bronchial wall thickening, suggesting COPD.   Electronically Signed  By: Jason A Poff M.D.  On: 07/18/2015 14:04    ASSESSMENT: Carcinoma of lung stage II status post resection and adjuvant chemotherapy CT scan of the chest is been reviewed revealed new 5 mm left lower lobe nodule which needs to be observed another CT scan would be ordered in 3-6 month for evaluation CT scan has been reviewed dependently in reviewed with the patient. COPD Chronic smoking Continue follow-up CT scan has been reviewed.  Independently in reviewed with the patient.  Previously described 5 mm nodule in the left upper lobe has resolved. Patient was again counseled regarding smoking Exact  etiology of nausea is not clear  Persist further evaluation would be planned with ultrasound to rule out gallbladder disease and upper endoscopy Patient expressed understanding and was in agreement with this plan. She also understands that She can call clinic at any time with any questions, concerns, or complaints.    No matching  staging information was found for the patient.  Janak Choksi, MD   07/21/2015 6:36 PM    

## 2015-09-11 DIAGNOSIS — R5382 Chronic fatigue, unspecified: Secondary | ICD-10-CM | POA: Insufficient documentation

## 2015-09-18 DIAGNOSIS — H40013 Open angle with borderline findings, low risk, bilateral: Secondary | ICD-10-CM | POA: Insufficient documentation

## 2015-09-18 DIAGNOSIS — H2513 Age-related nuclear cataract, bilateral: Secondary | ICD-10-CM | POA: Insufficient documentation

## 2015-09-18 DIAGNOSIS — H02831 Dermatochalasis of right upper eyelid: Secondary | ICD-10-CM | POA: Insufficient documentation

## 2015-09-18 DIAGNOSIS — E119 Type 2 diabetes mellitus without complications: Secondary | ICD-10-CM | POA: Insufficient documentation

## 2015-09-18 DIAGNOSIS — H524 Presbyopia: Secondary | ICD-10-CM | POA: Insufficient documentation

## 2015-10-23 DIAGNOSIS — I739 Peripheral vascular disease, unspecified: Secondary | ICD-10-CM | POA: Insufficient documentation

## 2015-12-03 DIAGNOSIS — N393 Stress incontinence (female) (male): Secondary | ICD-10-CM | POA: Insufficient documentation

## 2016-01-17 ENCOUNTER — Other Ambulatory Visit: Payer: Self-pay

## 2016-01-17 DIAGNOSIS — C3490 Malignant neoplasm of unspecified part of unspecified bronchus or lung: Secondary | ICD-10-CM

## 2016-01-18 ENCOUNTER — Inpatient Hospital Stay: Payer: Medicare Other | Attending: Internal Medicine

## 2016-01-18 ENCOUNTER — Inpatient Hospital Stay (HOSPITAL_BASED_OUTPATIENT_CLINIC_OR_DEPARTMENT_OTHER): Payer: Medicare Other | Admitting: Internal Medicine

## 2016-01-18 DIAGNOSIS — Z79899 Other long term (current) drug therapy: Secondary | ICD-10-CM

## 2016-01-18 DIAGNOSIS — Z7984 Long term (current) use of oral hypoglycemic drugs: Secondary | ICD-10-CM

## 2016-01-18 DIAGNOSIS — I2510 Atherosclerotic heart disease of native coronary artery without angina pectoris: Secondary | ICD-10-CM

## 2016-01-18 DIAGNOSIS — I252 Old myocardial infarction: Secondary | ICD-10-CM

## 2016-01-18 DIAGNOSIS — R1310 Dysphagia, unspecified: Secondary | ICD-10-CM | POA: Diagnosis not present

## 2016-01-18 DIAGNOSIS — Z902 Acquired absence of lung [part of]: Secondary | ICD-10-CM | POA: Diagnosis not present

## 2016-01-18 DIAGNOSIS — C3490 Malignant neoplasm of unspecified part of unspecified bronchus or lung: Secondary | ICD-10-CM

## 2016-01-18 DIAGNOSIS — E119 Type 2 diabetes mellitus without complications: Secondary | ICD-10-CM | POA: Diagnosis not present

## 2016-01-18 DIAGNOSIS — R05 Cough: Secondary | ICD-10-CM | POA: Insufficient documentation

## 2016-01-18 DIAGNOSIS — Z87891 Personal history of nicotine dependence: Secondary | ICD-10-CM | POA: Diagnosis not present

## 2016-01-18 DIAGNOSIS — G629 Polyneuropathy, unspecified: Secondary | ICD-10-CM | POA: Diagnosis not present

## 2016-01-18 DIAGNOSIS — Z8669 Personal history of other diseases of the nervous system and sense organs: Secondary | ICD-10-CM

## 2016-01-18 DIAGNOSIS — I10 Essential (primary) hypertension: Secondary | ICD-10-CM

## 2016-01-18 DIAGNOSIS — E7800 Pure hypercholesterolemia, unspecified: Secondary | ICD-10-CM

## 2016-01-18 DIAGNOSIS — C3412 Malignant neoplasm of upper lobe, left bronchus or lung: Secondary | ICD-10-CM

## 2016-01-18 DIAGNOSIS — R0602 Shortness of breath: Secondary | ICD-10-CM | POA: Insufficient documentation

## 2016-01-18 DIAGNOSIS — Z9221 Personal history of antineoplastic chemotherapy: Secondary | ICD-10-CM

## 2016-01-18 DIAGNOSIS — Z85118 Personal history of other malignant neoplasm of bronchus and lung: Secondary | ICD-10-CM | POA: Diagnosis not present

## 2016-01-18 LAB — CBC WITH DIFFERENTIAL/PLATELET
BASOS ABS: 0.1 10*3/uL (ref 0–0.1)
Basophils Relative: 1 %
EOS ABS: 0.1 10*3/uL (ref 0–0.7)
Eosinophils Relative: 2 %
HCT: 39.2 % (ref 35.0–47.0)
Hemoglobin: 13 g/dL (ref 12.0–16.0)
Lymphocytes Relative: 24 %
Lymphs Abs: 1.4 10*3/uL (ref 1.0–3.6)
MCH: 28 pg (ref 26.0–34.0)
MCHC: 33.3 g/dL (ref 32.0–36.0)
MCV: 84.1 fL (ref 80.0–100.0)
MONO ABS: 0.6 10*3/uL (ref 0.2–0.9)
Monocytes Relative: 10 %
Neutro Abs: 3.7 10*3/uL (ref 1.4–6.5)
Neutrophils Relative %: 63 %
Platelets: 188 10*3/uL (ref 150–440)
RBC: 4.66 MIL/uL (ref 3.80–5.20)
RDW: 17 % — AB (ref 11.5–14.5)
WBC: 6 10*3/uL (ref 3.6–11.0)

## 2016-01-18 LAB — COMPREHENSIVE METABOLIC PANEL
ALT: 17 U/L (ref 14–54)
AST: 21 U/L (ref 15–41)
Albumin: 4.3 g/dL (ref 3.5–5.0)
Alkaline Phosphatase: 65 U/L (ref 38–126)
Anion gap: 7 (ref 5–15)
BUN: 14 mg/dL (ref 6–20)
CHLORIDE: 98 mmol/L — AB (ref 101–111)
CO2: 32 mmol/L (ref 22–32)
CREATININE: 0.72 mg/dL (ref 0.44–1.00)
Calcium: 9.3 mg/dL (ref 8.9–10.3)
Glucose, Bld: 106 mg/dL — ABNORMAL HIGH (ref 65–99)
POTASSIUM: 4 mmol/L (ref 3.5–5.1)
SODIUM: 137 mmol/L (ref 135–145)
Total Bilirubin: 0.4 mg/dL (ref 0.3–1.2)
Total Protein: 7.7 g/dL (ref 6.5–8.1)

## 2016-01-18 NOTE — Progress Notes (Signed)
[place] OFFICE PROGRESS NOTE  Patient Care Team: Lazaro Arms, MD as PCP - General (Family Medicine)  No matching staging information was found for the patient.   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 Adjuvant chemotherapy with carboplatinum and Alimta started in November of 2012; last CT scan 2017- NED.   # quit smoking- AUG 2017.       Primary cancer of left upper lobe of lung [facility])    This is my first interaction with the patient as patient's primary oncologist has been Dr.Choksi. I reviewed the patient's prior charts/pertinent labs/imaging in detail; findings are summarized above.     INTERVAL HISTORY:  Kaitlyn Mcbride 65 y.o.  female pleasant patient above history of stage II adenocarcinoma of the lung status post surgery followed by adjuvant chemotherapy 2012 is here for follow-up. Patient quit smoking approximately 2 months ago.  Patient has chronic mild cough chronic mild shortness of breath. Not any worse. Denies any bone pain. Patient has chronic tingling and numbness in the feet not any worse however not completely resolved. Patient is on Neurontin.   REVIEW OF SYSTEMS:  A complete 10 point review of system is done which is negative except mentioned above/history of present illness.   PAST MEDICAL HISTORY :  Past Medical History:  Diagnosis Date  . Cancer ([place])   . Diabetes mellitus without complication ([place])   . Dysphagia   . Hypercholesteremia   . Hypertension   . Lung cancer ([place])   . Migraine   . Myocardial infarction     PAST SURGICAL HISTORY :   Past Surgical History:  Procedure Laterality Date  . LUNG REMOVAL, PARTIAL Left    upper lobe  . THORACOTOMY Left     FAMILY HISTORY :  No family history on file.  SOCIAL HISTORY:   Social History  Substance Use Topics  . Smoking  status: Former Smoker    Types: Cigarettes    Quit date: 02/01/2012  . Smokeless tobacco: Not on file  . Alcohol use Not on file    ALLERGIES:  has No Known Allergies.  MEDICATIONS:  Current Outpatient Prescriptions  Medication Sig Dispense Refill  . acetaminophen (TYLENOL) 325 MG tablet Take 650 mg by mouth every 6 (six) hours as needed.    Marland Kitchen albuterol (PROVENTIL HFA;VENTOLIN HFA) 108 (90 Base) MCG/ACT inhaler Inhale into the lungs.    Marland Kitchen albuterol (PROVENTIL) (5 MG/ML) 0.5% nebulizer solution Take by nebulization 3 (three) times daily.    Marland Kitchen atorvastatin (LIPITOR) 10 MG tablet Take 10 mg by mouth.    Marland Kitchen azithromycin (ZITHROMAX) 500 MG tablet Take 1 tablet (500 mg total) by mouth daily. 5 tablet 0  . esomeprazole (NEXIUM) 40 MG capsule Take 40 mg by mouth daily at 12 noon.    . Fluticasone-Salmeterol (ADVAIR) 250-50 MCG/DOSE AEPB Inhale 1 puff into the lungs 2 (two) times daily.    Marland Kitchen gabapentin (NEURONTIN) 600 MG tablet Take 600 mg by mouth 3 (three) times daily.    Marland Kitchen guaiFENesin (MUCINEX) 600 MG 12 hr tablet Take by mouth every 12 (twelve) hours.    . montelukast (SINGULAIR) 10 MG tablet Take 10 mg by mouth.    . nitroGLYCERIN (NITROSTAT) 0.4 MG SL tablet Place 0.4 mg under the tongue every 5 (five) minutes as needed for  chest pain.    Marland Kitchen oxyCODONE (OXY IR/ROXICODONE) 5 MG immediate release tablet Take 5 mg by mouth.    . sennosides-docusate sodium (SENOKOT-S) 8.6-50 MG tablet Take 1 tablet by mouth daily.    Marland Kitchen tiotropium (SPIRIVA) 18 MCG inhalation capsule Place 18 mcg into inhaler and inhale daily.    Marland Kitchen venlafaxine XR (EFFEXOR-XR) 150 MG 24 hr capsule Take 150 mg by mouth 2 (two) times daily.    . metFORMIN (GLUCOPHAGE) 500 MG tablet Take by mouth 2 (two) times daily with a meal.     No current facility-administered medications for this visit.     PHYSICAL EXAMINATION: ECOG PERFORMANCE STATUS: 0 - Asymptomatic  BP (!) 168/101 (BP Location: Left Arm, Patient Position: Sitting)    Pulse 97   Temp 97.5 F (36.4 C) (Tympanic)   Wt 122 lb 2 oz (55.4 kg)   Filed Weights   01/18/16 1035  Weight: 122 lb 2 oz (55.4 kg)    GENERAL: Well-nourished well-developed; Alert, no distress and comfortable.   With family.  EYES: no pallor or icterus OROPHARYNX: no thrush or ulceration; good dentition  NECK: supple, no masses felt LYMPH:  no palpable lymphadenopathy in the cervical, axillary or inguinal regions LUNGS: clear to auscultation and  No wheeze or crackles HEART/CVS: regular rate & rhythm and no murmurs; No lower extremity edema ABDOMEN:abdomen soft, non-tender and normal bowel sounds Musculoskeletal:no cyanosis of digits and no clubbing  PSYCH: alert & oriented x 3 with fluent speech NEURO: no focal motor/sensory deficits SKIN:  no rashes or significant lesions  LABORATORY DATA:  I have reviewed the data as listed    Component Value Date/Time   NA 137 01/18/2016 1000   NA 135 06/22/2014 0958   K 4.0 01/18/2016 1000   K 4.5 06/22/2014 0958   CL 98 (L) 01/18/2016 1000   CL 97 (L) 06/22/2014 0958   CO2 32 01/18/2016 1000   CO2 30 06/22/2014 0958   GLUCOSE 106 (H) 01/18/2016 1000   GLUCOSE 64 (L) 06/22/2014 0958   BUN 14 01/18/2016 1000   BUN 17 06/22/2014 0958   CREATININE 0.72 01/18/2016 1000   CREATININE 0.79 06/22/2014 0958   CALCIUM 9.3 01/18/2016 1000   CALCIUM 9.6 06/22/2014 0958   PROT 7.7 01/18/2016 1000   PROT 7.6 06/22/2014 0958   ALBUMIN 4.3 01/18/2016 1000   ALBUMIN 4.1 06/22/2014 0958   AST 21 01/18/2016 1000   AST 21 06/22/2014 0958   ALT 17 01/18/2016 1000   ALT 19 06/22/2014 0958   ALKPHOS 65 01/18/2016 1000   ALKPHOS 63 06/22/2014 0958   BILITOT 0.4 01/18/2016 1000   BILITOT 0.5 06/22/2014 0958   GFRNONAA >60 01/18/2016 1000   GFRNONAA >60 06/22/2014 0958   GFRAA >60 01/18/2016 1000   GFRAA >60 06/22/2014 0958    No results found for: SPEP, UPEP  Lab Results  Component Value Date   WBC 6.0 01/18/2016   NEUTROABS 3.7  01/18/2016   HGB 13.0 01/18/2016   HCT 39.2 01/18/2016   MCV 84.1 01/18/2016   PLT 188 01/18/2016      Chemistry      Component Value Date/Time   NA 137 01/18/2016 1000   NA 135 06/22/2014 0958   K 4.0 01/18/2016 1000   K 4.5 06/22/2014 0958   CL 98 (L) 01/18/2016 1000   CL 97 (L) 06/22/2014 0958   CO2 32 01/18/2016 1000   CO2 30 06/22/2014 0958   BUN 14 01/18/2016 1000  BUN 17 06/22/2014 0958   CREATININE 0.72 01/18/2016 1000   CREATININE 0.79 06/22/2014 0958      Component Value Date/Time   CALCIUM 9.3 01/18/2016 1000   CALCIUM 9.6 06/22/2014 0958   ALKPHOS 65 01/18/2016 1000   ALKPHOS 63 06/22/2014 0958   AST 21 01/18/2016 1000   AST 21 06/22/2014 0958   ALT 17 01/18/2016 1000   ALT 19 06/22/2014 0958   BILITOT 0.4 01/18/2016 1000   BILITOT 0.5 06/22/2014 0958     IMPRESSION: 1. No evidence of metastatic disease in the chest. Previously described 5 mm left lower lobe pulmonary nodule has resolved. 2. Status post left upper lobectomy. No evidence of local tumor recurrence. 3. Three-vessel coronary atherosclerosis. 4. Moderate emphysema diffuse bronchial wall thickening, suggesting COPD.   Electronically Signed   By: Ilona Sorrel M.D.   On: 07/18/2015 14:04  RADIOGRAPHIC STUDIES: I have personally reviewed the radiological images as listed and agreed with the findings in the report. No results found.   ASSESSMENT & PLAN:  Primary cancer of left upper lobe of lung ([place]) # Lung cancer s/p adjuvant chemo. Last CT may 2017- NED. Clinically no evidence of recurrence.  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 Recommend follow up with neurology.   # lung cancer screening- pt interested; will contact shawn.  # follow up in 6 months/labs.    Orders Placed This Encounter  Procedures  . CBC with Differential    Standing Status:   Future    Standing Expiration Date:   01/17/2017  . Comprehensive metabolic panel    Standing Status:   Future     Standing Expiration Date:   01/17/2017   All questions were answered. The patient knows to call the clinic with any problems, questions or concerns.      Cammie Sickle, MD 01/18/2016 11:15 AM

## 2016-01-18 NOTE — Progress Notes (Signed)
Patient is here today for follow up, she is doing well. She has some complaints about her legs and feet. They hurt her more at night.

## 2016-01-18 NOTE — Assessment & Plan Note (Signed)
#   Lung cancer s/p adjuvant chemo. Last CT may 2017- NED. Clinically no evidence of recurrence.  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 Recommend follow up with neurology.   # lung cancer screening- pt interested; will contact shawn.  # follow up in 6 months/labs.

## 2016-01-23 DIAGNOSIS — J9621 Acute and chronic respiratory failure with hypoxia: Secondary | ICD-10-CM | POA: Insufficient documentation

## 2016-01-29 ENCOUNTER — Telehealth: Payer: Self-pay | Admitting: *Deleted

## 2016-01-29 NOTE — Telephone Encounter (Signed)
Received referral for low dose lung cancer screening CT scan. Voicemail left at phone number listed in EMR for patient to call me back to facilitate scheduling scan.  

## 2016-02-05 ENCOUNTER — Telehealth: Payer: Self-pay | Admitting: *Deleted

## 2016-02-05 NOTE — Telephone Encounter (Signed)
Received referral for initial lung cancer screening scan. Contacted patient and upon review of chart patient is not due for screening scan until May of 2018. Informed significant other that will plan to start screening at that time.

## 2016-07-15 ENCOUNTER — Telehealth: Payer: Self-pay | Admitting: *Deleted

## 2016-07-15 ENCOUNTER — Encounter: Payer: Self-pay | Admitting: *Deleted

## 2016-07-15 DIAGNOSIS — Z87891 Personal history of nicotine dependence: Secondary | ICD-10-CM

## 2016-07-15 NOTE — Telephone Encounter (Signed)
Received referral for initial lung cancer screening scan. Contacted patient and obtained smoking history,(former, quit 10/02/15, 45 pack year ) as well as answering questions related to screening process. Patient denies signs of lung cancer such as weight loss or hemoptysis. Patient denies comorbidity that would prevent curative treatment if lung cancer were found. Patient is scheduled for shared decision making visit and CT scan on 07/29/16.

## 2016-07-18 ENCOUNTER — Inpatient Hospital Stay: Payer: Medicare Other | Admitting: Internal Medicine

## 2016-07-18 ENCOUNTER — Inpatient Hospital Stay: Payer: Medicare Other

## 2016-07-18 NOTE — Assessment & Plan Note (Deleted)
#   Lung cancer s/p adjuvant chemo. Last CT may 2017- NED. Clinically no evidence of recurrence.  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 Recommend follow up with neurology.   # lung cancer screening- pt interested; will contact shawn.  # follow up in 6 months/labs.

## 2016-07-18 NOTE — Progress Notes (Deleted)
[place] OFFICE PROGRESS NOTE  Patient Care Team: Lazaro Arms, MD as PCP - General (Family Medicine)  Cancer Staging No matching staging information was found for the patient.   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 Adjuvant chemotherapy with carboplatinum and Alimta started in November of 2012; last CT scan 2017- NED.   # quit smoking- AUG 2017.       Primary cancer of left upper lobe of lung [facility])    This is my first interaction with the patient as patient's primary oncologist has been Dr.Choksi. I reviewed the patient's prior charts/pertinent labs/imaging in detail; findings are summarized above.     INTERVAL HISTORY:  Kaitlyn Mcbride 66 y.o.  female pleasant patient above history of stage II adenocarcinoma of the lung status post surgery followed by adjuvant chemotherapy 2012 is here for follow-up. Patient quit smoking approximately 2 months ago.  Patient has chronic mild cough chronic mild shortness of breath. Not any worse. Denies any bone pain. Patient has chronic tingling and numbness in the feet not any worse however not completely resolved. Patient is on Neurontin.   REVIEW OF SYSTEMS:  A complete 10 point review of system is done which is negative except mentioned above/history of present illness.   PAST MEDICAL HISTORY :  Past Medical History:  Diagnosis Date  . Cancer ([place])   . Diabetes mellitus without complication ([place])   . Dysphagia   . Hypercholesteremia   . Hypertension   . Lung cancer ([place])   . Migraine   . Myocardial infarction [facility])     PAST SURGICAL HISTORY :   Past Surgical History:  Procedure Laterality Date  . LUNG REMOVAL, PARTIAL Left    upper lobe  . THORACOTOMY Left     FAMILY HISTORY :  No family history on file.  SOCIAL HISTORY:   Social History  Substance Use  Topics  . Smoking status: Former Smoker    Packs/day: 0.75    Years: 60.00    Types: Cigarettes    Quit date: 10/02/2015  . Smokeless tobacco: Not on file  . Alcohol use Not on file    ALLERGIES:  has No Known Allergies.  MEDICATIONS:  Current Outpatient Prescriptions  Medication Sig Dispense Refill  . acetaminophen (TYLENOL) 325 MG tablet Take 650 mg by mouth every 6 (six) hours as needed.    Marland Kitchen albuterol (PROVENTIL HFA;VENTOLIN HFA) 108 (90 Base) MCG/ACT inhaler Inhale into the lungs.    Marland Kitchen albuterol (PROVENTIL) (5 MG/ML) 0.5% nebulizer solution Take by nebulization 3 (three) times daily.    Marland Kitchen atorvastatin (LIPITOR) 10 MG tablet Take 10 mg by mouth.    Marland Kitchen azithromycin (ZITHROMAX) 500 MG tablet Take 1 tablet (500 mg total) by mouth daily. 5 tablet 0  . esomeprazole (NEXIUM) 40 MG capsule Take 40 mg by mouth daily at 12 noon.    . Fluticasone-Salmeterol (ADVAIR) 250-50 MCG/DOSE AEPB Inhale 1 puff into the lungs 2 (two) times daily.    Marland Kitchen gabapentin (NEURONTIN) 600 MG tablet Take 600 mg by mouth 3 (three) times daily.    Marland Kitchen guaiFENesin (MUCINEX) 600 MG 12 hr tablet Take by mouth every 12 (twelve) hours.    . metFORMIN (GLUCOPHAGE) 500 MG tablet Take by mouth 2 (two) times daily with a meal.    . montelukast (  SINGULAIR) 10 MG tablet Take 10 mg by mouth.    . nitroGLYCERIN (NITROSTAT) 0.4 MG SL tablet Place 0.4 mg under the tongue every 5 (five) minutes as needed for chest pain.    Marland Kitchen oxyCODONE (OXY IR/ROXICODONE) 5 MG immediate release tablet Take 5 mg by mouth.    . sennosides-docusate sodium (SENOKOT-S) 8.6-50 MG tablet Take 1 tablet by mouth daily.    Marland Kitchen tiotropium (SPIRIVA) 18 MCG inhalation capsule Place 18 mcg into inhaler and inhale daily.    Marland Kitchen venlafaxine XR (EFFEXOR-XR) 150 MG 24 hr capsule Take 150 mg by mouth 2 (two) times daily.     No current facility-administered medications for this visit.     PHYSICAL EXAMINATION: ECOG PERFORMANCE STATUS: 0 - Asymptomatic  There were no  vitals taken for this visit.  There were no vitals filed for this visit.  GENERAL: Well-nourished well-developed; Alert, no distress and comfortable.   With family.  EYES: no pallor or icterus OROPHARYNX: no thrush or ulceration; good dentition  NECK: supple, no masses felt LYMPH:  no palpable lymphadenopathy in the cervical, axillary or inguinal regions LUNGS: clear to auscultation and  No wheeze or crackles HEART/CVS: regular rate & rhythm and no murmurs; No lower extremity edema ABDOMEN:abdomen soft, non-tender and normal bowel sounds Musculoskeletal:no cyanosis of digits and no clubbing  PSYCH: alert & oriented x 3 with fluent speech NEURO: no focal motor/sensory deficits SKIN:  no rashes or significant lesions  LABORATORY DATA:  I have reviewed the data as listed    Component Value Date/Time   NA 137 01/18/2016 1000   NA 135 06/22/2014 0958   K 4.0 01/18/2016 1000   K 4.5 06/22/2014 0958   CL 98 (L) 01/18/2016 1000   CL 97 (L) 06/22/2014 0958   CO2 32 01/18/2016 1000   CO2 30 06/22/2014 0958   GLUCOSE 106 (H) 01/18/2016 1000   GLUCOSE 64 (L) 06/22/2014 0958   BUN 14 01/18/2016 1000   BUN 17 06/22/2014 0958   CREATININE 0.72 01/18/2016 1000   CREATININE 0.79 06/22/2014 0958   CALCIUM 9.3 01/18/2016 1000   CALCIUM 9.6 06/22/2014 0958   PROT 7.7 01/18/2016 1000   PROT 7.6 06/22/2014 0958   ALBUMIN 4.3 01/18/2016 1000   ALBUMIN 4.1 06/22/2014 0958   AST 21 01/18/2016 1000   AST 21 06/22/2014 0958   ALT 17 01/18/2016 1000   ALT 19 06/22/2014 0958   ALKPHOS 65 01/18/2016 1000   ALKPHOS 63 06/22/2014 0958   BILITOT 0.4 01/18/2016 1000   BILITOT 0.5 06/22/2014 0958   GFRNONAA >60 01/18/2016 1000   GFRNONAA >60 06/22/2014 0958   GFRAA >60 01/18/2016 1000   GFRAA >60 06/22/2014 0958    No results found for: SPEP, UPEP  Lab Results  Component Value Date   WBC 6.0 01/18/2016   NEUTROABS 3.7 01/18/2016   HGB 13.0 01/18/2016   HCT 39.2 01/18/2016   MCV 84.1  01/18/2016   PLT 188 01/18/2016      Chemistry      Component Value Date/Time   NA 137 01/18/2016 1000   NA 135 06/22/2014 0958   K 4.0 01/18/2016 1000   K 4.5 06/22/2014 0958   CL 98 (L) 01/18/2016 1000   CL 97 (L) 06/22/2014 0958   CO2 32 01/18/2016 1000   CO2 30 06/22/2014 0958   BUN 14 01/18/2016 1000   BUN 17 06/22/2014 0958   CREATININE 0.72 01/18/2016 1000   CREATININE 0.79 06/22/2014 3662  Component Value Date/Time   CALCIUM 9.3 01/18/2016 1000   CALCIUM 9.6 06/22/2014 0958   ALKPHOS 65 01/18/2016 1000   ALKPHOS 63 06/22/2014 0958   AST 21 01/18/2016 1000   AST 21 06/22/2014 0958   ALT 17 01/18/2016 1000   ALT 19 06/22/2014 0958   BILITOT 0.4 01/18/2016 1000   BILITOT 0.5 06/22/2014 0958     IMPRESSION: 1. No evidence of metastatic disease in the chest. Previously described 5 mm left lower lobe pulmonary nodule has resolved. 2. Status post left upper lobectomy. No evidence of local tumor recurrence. 3. Three-vessel coronary atherosclerosis. 4. Moderate emphysema diffuse bronchial wall thickening, suggesting COPD.   Electronically Signed   By: Ilona Sorrel M.D.   On: 07/18/2015 14:04  RADIOGRAPHIC STUDIES: I have personally reviewed the radiological images as listed and agreed with the findings in the report. No results found.   ASSESSMENT & PLAN:  No problem-specific Assessment & Plan notes found for this encounter.   No orders of the defined types were placed in this encounter.  All questions were answered. The patient knows to call the clinic with any problems, questions or concerns.      Cammie Sickle, MD 07/18/2016 8:33 AM

## 2016-07-29 ENCOUNTER — Inpatient Hospital Stay: Payer: Medicare Other | Attending: Oncology | Admitting: Oncology

## 2016-07-29 ENCOUNTER — Ambulatory Visit
Admission: RE | Admit: 2016-07-29 | Discharge: 2016-07-29 | Disposition: A | Discharge status: Home / Self Care | Payer: Medicare Other | Source: Ambulatory Visit | Attending: Oncology | Admitting: Oncology

## 2016-07-29 ENCOUNTER — Ambulatory Visit: Payer: Medicare Other | Admitting: Oncology

## 2016-07-29 DIAGNOSIS — J439 Emphysema, unspecified: Secondary | ICD-10-CM | POA: Diagnosis not present

## 2016-07-29 DIAGNOSIS — Z87891 Personal history of nicotine dependence: Secondary | ICD-10-CM | POA: Insufficient documentation

## 2016-07-29 DIAGNOSIS — I700 Atherosclerosis of aorta: Secondary | ICD-10-CM | POA: Insufficient documentation

## 2016-07-29 DIAGNOSIS — Z85118 Personal history of other malignant neoplasm of bronchus and lung: Secondary | ICD-10-CM | POA: Insufficient documentation

## 2016-07-29 DIAGNOSIS — Z122 Encounter for screening for malignant neoplasm of respiratory organs: Secondary | ICD-10-CM | POA: Diagnosis not present

## 2016-07-29 DIAGNOSIS — J479 Bronchiectasis, uncomplicated: Secondary | ICD-10-CM | POA: Diagnosis not present

## 2016-07-29 DIAGNOSIS — R200 Anesthesia of skin: Secondary | ICD-10-CM | POA: Insufficient documentation

## 2016-07-29 DIAGNOSIS — R05 Cough: Secondary | ICD-10-CM | POA: Insufficient documentation

## 2016-07-29 NOTE — Progress Notes (Signed)
In accordance with CMS guidelines, patient has met eligibility criteria including age, absence of signs or symptoms of lung cancer.  Social History  Substance Use Topics  . Smoking status: Former Smoker    Packs/day: 0.75    Years: 60.00    Types: Cigarettes    Quit date: 10/02/2015  . Smokeless tobacco: Not on file  . Alcohol use Not on file     A shared decision-making session was conducted prior to the performance of CT scan. This includes one or more decision aids, includes benefits and harms of screening, follow-up diagnostic testing, over-diagnosis, false positive rate, and total radiation exposure.  Counseling on the importance of adherence to annual lung cancer LDCT screening, impact of co-morbidities, and ability or willingness to undergo diagnosis and treatment is imperative for compliance of the program.  Counseling on the importance of continued smoking cessation for former smokers; the importance of smoking cessation for current smokers, and information about tobacco cessation interventions have been given to patient including [place] and 1800 quit [place] programs.  Written order for lung cancer screening with LDCT has been given to the patient and any and all questions have been answered to the best of my abilities.   Yearly follow up will be coordinated by Burgess Estelle, Thoracic Navigator.

## 2016-07-30 ENCOUNTER — Inpatient Hospital Stay (HOSPITAL_BASED_OUTPATIENT_CLINIC_OR_DEPARTMENT_OTHER): Payer: Medicare Other | Admitting: Internal Medicine

## 2016-07-30 VITALS — BP 84/54 | HR 89 | Temp 97.2°F | Wt 118.0 lb

## 2016-07-30 DIAGNOSIS — C3412 Malignant neoplasm of upper lobe, left bronchus or lung: Secondary | ICD-10-CM

## 2016-07-30 DIAGNOSIS — Z85118 Personal history of other malignant neoplasm of bronchus and lung: Secondary | ICD-10-CM | POA: Diagnosis present

## 2016-07-30 DIAGNOSIS — Z87891 Personal history of nicotine dependence: Secondary | ICD-10-CM

## 2016-07-30 DIAGNOSIS — R918 Other nonspecific abnormal finding of lung field: Secondary | ICD-10-CM

## 2016-07-30 DIAGNOSIS — R200 Anesthesia of skin: Secondary | ICD-10-CM | POA: Diagnosis not present

## 2016-07-30 DIAGNOSIS — R05 Cough: Secondary | ICD-10-CM

## 2016-07-30 NOTE — Progress Notes (Signed)
[place] OFFICE PROGRESS NOTE  Patient Care Team: Maryruth Eve, MD as PCP - General (Family Medicine)  Cancer Staging No matching staging information was found for the patient.   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 Adjuvant chemotherapy with carboplatinum and Alimta started in November of 2012; last CT scan 2017- NED.   # quit smoking- AUG 2017.       Primary cancer of left upper lobe of lung ([place])      INTERVAL HISTORY:  Kaitlyn Mcbride 66 y.o.  female pleasant patient above history of stage II adenocarcinoma of the lung status post surgery followed by adjuvant chemotherapy 2012 is here for follow-up.   Chronic mild shortness of breath.  Chronic cough.  Denies any bone pain.  Chronic tingling and numbness in the extremities.  Patient continues to be Neurontin.   REVIEW OF SYSTEMS:  A complete 10 point review of system is done which is negative except mentioned above/history of present illness.   PAST MEDICAL HISTORY :  Past Medical History:  Diagnosis Date  . Cancer ([place])   . Diabetes mellitus without complication ([place])   . Dysphagia   . Hypercholesteremia   . Hypertension   . Lung cancer ([place])   . Migraine   . Myocardial infarction [facility])     PAST SURGICAL HISTORY :   Past Surgical History:  Procedure Laterality Date  . LUNG REMOVAL, PARTIAL Left    upper lobe  . THORACOTOMY Left     FAMILY HISTORY :  No family history on file.  SOCIAL HISTORY:   Social History   Tobacco Use  . Smoking status: Former Smoker    Packs/day: 0.75    Years: 60.00    Pack years: 45.00    Types: Cigarettes    Last attempt to quit: 10/02/2015    Years since quitting: 2.0  Substance Use Topics  . Alcohol use: Not on file  . Drug use: No    ALLERGIES:  has No Known Allergies.  MEDICATIONS:  Current  Outpatient Medications  Medication Sig Dispense Refill  . acetaminophen (TYLENOL) 325 MG tablet Take 650 mg by mouth every 6 (six) hours as needed.    Marland Kitchen albuterol (PROVENTIL HFA;VENTOLIN HFA) 108 (90 Base) MCG/ACT inhaler Inhale into the lungs.    Marland Kitchen albuterol (PROVENTIL) (5 MG/ML) 0.5% nebulizer solution Take by nebulization 3 (three) times daily.    Marland Kitchen esomeprazole (NEXIUM) 40 MG capsule Take 40 mg by mouth daily at 12 noon.    . Fluticasone-Salmeterol (ADVAIR) 250-50 MCG/DOSE AEPB Inhale 1 puff into the lungs 2 (two) times daily.    . furosemide (LASIX) 20 MG tablet Take 20 mg by mouth.    . gabapentin (NEURONTIN) 600 MG tablet Take 600 mg by mouth 3 (three) times daily.    Marland Kitchen lisinopril (PRINIVIL,ZESTRIL) 20 MG tablet Take 20 mg by mouth.    . metoprolol succinate (TOPROL-XL) 50 MG 24 hr tablet Take 50 mg by mouth.    . nicotine (NICODERM CQ - DOSED IN MG/24 HOURS) 21 mg/24hr patch Place onto the skin.    Marland Kitchen nitroGLYCERIN (NITROSTAT) 0.4 MG SL tablet Place 0.4 mg under the tongue every 5 (five) minutes as needed for chest pain.    Marland Kitchen oxyCODONE (OXY IR/ROXICODONE) 5 MG immediate release tablet Take 5 mg by  mouth.    . Polyethyl Glycol-Propyl Glycol 0.4-0.3 % SOLN Place 2 drops into both eyes as needed.    . simvastatin (ZOCOR) 20 MG tablet Take 20 mg by mouth.    . tiotropium (SPIRIVA) 18 MCG inhalation capsule Place 18 mcg into inhaler and inhale daily.    Marland Kitchen venlafaxine XR (EFFEXOR-XR) 150 MG 24 hr capsule Take 150 mg by mouth 2 (two) times daily.    Marland Kitchen atorvastatin (LIPITOR) 10 MG tablet Take 10 mg by mouth.    . metFORMIN (GLUCOPHAGE) 500 MG tablet Take by mouth 2 (two) times daily with a meal.     No current facility-administered medications for this visit.     PHYSICAL EXAMINATION: ECOG PERFORMANCE STATUS: 0 - Asymptomatic  BP (!) 84/54 (BP Location: Left Arm, Patient Position: Sitting)   Pulse 89   Temp 97.2 F (36.2 C) (Tympanic)   Wt 118 lb (53.5 kg)   SpO2 91%   BMI 22.30 kg/m    Filed Weights   07/30/16 1347  Weight: 118 lb (53.5 kg)    GENERAL: Well-nourished well-developed; Alert, no distress and comfortable.   With family.  EYES: no pallor or icterus OROPHARYNX: no thrush or ulceration; good dentition  NECK: supple, no masses felt LYMPH:  no palpable lymphadenopathy in the cervical, axillary or inguinal regions LUNGS: clear to auscultation and  No wheeze or crackles HEART/CVS: regular rate & rhythm and no murmurs; No lower extremity edema ABDOMEN:abdomen soft, non-tender and normal bowel sounds Musculoskeletal:no cyanosis of digits and no clubbing  PSYCH: alert & oriented x 3 with fluent speech NEURO: no focal motor/sensory deficits SKIN:  no rashes or significant lesions  LABORATORY DATA:  I have reviewed the data as listed    Component Value Date/Time   NA 138 01/14/2017 1415   NA 135 06/22/2014 0958   K 4.0 01/14/2017 1415   K 4.5 06/22/2014 0958   CL 92 (L) 01/14/2017 1415   CL 97 (L) 06/22/2014 0958   CO2 40 (H) 01/14/2017 1415   CO2 30 06/22/2014 0958   GLUCOSE 121 (H) 01/14/2017 1415   GLUCOSE 64 (L) 06/22/2014 0958   BUN 11 01/14/2017 1415   BUN 17 06/22/2014 0958   CREATININE 0.73 01/14/2017 1415   CREATININE 0.79 06/22/2014 0958   CALCIUM 9.6 01/14/2017 1415   CALCIUM 9.6 06/22/2014 0958   PROT 7.0 01/14/2017 1415   PROT 7.6 06/22/2014 0958   ALBUMIN 3.8 01/14/2017 1415   ALBUMIN 4.1 06/22/2014 0958   AST 20 01/14/2017 1415   AST 21 06/22/2014 0958   ALT 12 (L) 01/14/2017 1415   ALT 19 06/22/2014 0958   ALKPHOS 57 01/14/2017 1415   ALKPHOS 63 06/22/2014 0958   BILITOT 0.4 01/14/2017 1415   BILITOT 0.5 06/22/2014 0958   GFRNONAA >60 01/14/2017 1415   GFRNONAA >60 06/22/2014 0958   GFRAA >60 01/14/2017 1415   GFRAA >60 06/22/2014 0958    No results found for: SPEP, UPEP  Lab Results  Component Value Date   WBC 5.6 01/14/2017   NEUTROABS 3.6 01/14/2017   HGB 13.6 01/14/2017   HCT 41.7 01/14/2017   MCV 93.0  01/14/2017   PLT 174 01/14/2017      Chemistry      Component Value Date/Time   NA 138 01/14/2017 1415   NA 135 06/22/2014 0958   K 4.0 01/14/2017 1415   K 4.5 06/22/2014 0958   CL 92 (L) 01/14/2017 1415   CL 97 (L) 06/22/2014 3976  CO2 40 (H) 01/14/2017 1415   CO2 30 06/22/2014 0958   BUN 11 01/14/2017 1415   BUN 17 06/22/2014 0958   CREATININE 0.73 01/14/2017 1415   CREATININE 0.79 06/22/2014 0958      Component Value Date/Time   CALCIUM 9.6 01/14/2017 1415   CALCIUM 9.6 06/22/2014 0958   ALKPHOS 57 01/14/2017 1415   ALKPHOS 63 06/22/2014 0958   AST 20 01/14/2017 1415   AST 21 06/22/2014 0958   ALT 12 (L) 01/14/2017 1415   ALT 19 06/22/2014 0958   BILITOT 0.4 01/14/2017 1415   BILITOT 0.5 06/22/2014 0958     IMPRESSION: IMPRESSION: 1. Multiple bilateral pulmonary nodules with a pleural-based right lower lobe nodule measuring up to 6.9 mm. Lung-RADS 3, probably benign findings. Short-term follow-up in 6 months is recommended with repeat low-dose chest CT without contrast (please use the following order, "CT CHEST LCS NODULE FOLLOW-UP W/O CM"). 2. Moderate emphysema. 3. Bronchiectasis and airway impaction posterior right lower lobe. This may be related to atypical infection although in the appropriate clinical setting, aspiration could have these CT imaging features. 4. Coronary artery and thoracoabdominal aortic atherosclerosis.   Electronically Signed   By: Misty Stanley M.D.   On: 07/30/2016 08:23  RADIOGRAPHIC STUDIES: I have personally reviewed the radiological images as listed and agreed with the findings in the report. No results found.   ASSESSMENT & PLAN:  Primary cancer of left upper lobe of lung ([place]) # Lung cancer s/p adjuvant chemo. Last CT may 2017- NED. Clinically no evidence of recurrence.  # ? Aspiration- ? EGD 6 months ago  # follow up in 6 months/labs; CT scan prior.  CC:  Dr.Tolbert; [place] [advance];   Orders Placed This  Encounter  Procedures  . CT CHEST LCS NODULE F/U WO  CONTRAST    Standing Status:   Future    Number of Occurrences:   1    Standing Expiration Date:   09/29/2017    Order Specific Question:   Reason for Exam (SYMPTOM  OR DIAGNOSIS REQUIRED)    Answer:   lung nodule    Order Specific Question:   Preferred Imaging Location?    Answer:   [facility]   All questions were answered. The patient knows to call the clinic with any problems, questions or concerns.      Cammie Sickle, MD 10/26/2017 11:17 PM

## 2016-07-30 NOTE — Assessment & Plan Note (Addendum)
#   Lung cancer s/p adjuvant chemo. Last CT may 2017- NED. Clinically no evidence of recurrence.  # ? Aspiration- ? EGD 6 months ago  # follow up in 6 months/labs; CT scan prior.  CC:  Dr.Tolbert; [place] [advance];

## 2017-01-14 ENCOUNTER — Inpatient Hospital Stay (HOSPITAL_BASED_OUTPATIENT_CLINIC_OR_DEPARTMENT_OTHER): Payer: Medicare Other | Admitting: Internal Medicine

## 2017-01-14 ENCOUNTER — Inpatient Hospital Stay: Payer: Medicare Other | Attending: Internal Medicine

## 2017-01-14 ENCOUNTER — Ambulatory Visit
Admission: RE | Admit: 2017-01-14 | Discharge: 2017-01-14 | Disposition: A | Discharge status: Home / Self Care | Payer: Medicare Other | Source: Ambulatory Visit | Attending: Internal Medicine | Admitting: Internal Medicine

## 2017-01-14 VITALS — BP 117/69 | HR 115 | Temp 97.9°F | Resp 22 | Ht 61.0 in | Wt 108.0 lb

## 2017-01-14 DIAGNOSIS — Z7982 Long term (current) use of aspirin: Secondary | ICD-10-CM | POA: Diagnosis not present

## 2017-01-14 DIAGNOSIS — Z79899 Other long term (current) drug therapy: Secondary | ICD-10-CM | POA: Insufficient documentation

## 2017-01-14 DIAGNOSIS — Z9221 Personal history of antineoplastic chemotherapy: Secondary | ICD-10-CM | POA: Diagnosis not present

## 2017-01-14 DIAGNOSIS — R918 Other nonspecific abnormal finding of lung field: Secondary | ICD-10-CM | POA: Diagnosis not present

## 2017-01-14 DIAGNOSIS — E119 Type 2 diabetes mellitus without complications: Secondary | ICD-10-CM

## 2017-01-14 DIAGNOSIS — Z85118 Personal history of other malignant neoplasm of bronchus and lung: Secondary | ICD-10-CM | POA: Insufficient documentation

## 2017-01-14 DIAGNOSIS — J479 Bronchiectasis, uncomplicated: Secondary | ICD-10-CM | POA: Diagnosis not present

## 2017-01-14 DIAGNOSIS — E7800 Pure hypercholesterolemia, unspecified: Secondary | ICD-10-CM | POA: Diagnosis not present

## 2017-01-14 DIAGNOSIS — Z902 Acquired absence of lung [part of]: Secondary | ICD-10-CM | POA: Diagnosis not present

## 2017-01-14 DIAGNOSIS — I700 Atherosclerosis of aorta: Secondary | ICD-10-CM | POA: Insufficient documentation

## 2017-01-14 DIAGNOSIS — I2510 Atherosclerotic heart disease of native coronary artery without angina pectoris: Secondary | ICD-10-CM | POA: Insufficient documentation

## 2017-01-14 DIAGNOSIS — C3412 Malignant neoplasm of upper lobe, left bronchus or lung: Secondary | ICD-10-CM

## 2017-01-14 DIAGNOSIS — I252 Old myocardial infarction: Secondary | ICD-10-CM | POA: Diagnosis not present

## 2017-01-14 DIAGNOSIS — R05 Cough: Secondary | ICD-10-CM | POA: Diagnosis not present

## 2017-01-14 DIAGNOSIS — R1310 Dysphagia, unspecified: Secondary | ICD-10-CM | POA: Diagnosis not present

## 2017-01-14 DIAGNOSIS — Z87891 Personal history of nicotine dependence: Secondary | ICD-10-CM

## 2017-01-14 DIAGNOSIS — Z7984 Long term (current) use of oral hypoglycemic drugs: Secondary | ICD-10-CM

## 2017-01-14 DIAGNOSIS — I10 Essential (primary) hypertension: Secondary | ICD-10-CM | POA: Diagnosis not present

## 2017-01-14 DIAGNOSIS — J439 Emphysema, unspecified: Secondary | ICD-10-CM

## 2017-01-14 DIAGNOSIS — Z9981 Dependence on supplemental oxygen: Secondary | ICD-10-CM | POA: Insufficient documentation

## 2017-01-14 LAB — COMPREHENSIVE METABOLIC PANEL
ALT: 12 U/L — AB (ref 14–54)
ANION GAP: 6 (ref 5–15)
AST: 20 U/L (ref 15–41)
Albumin: 3.8 g/dL (ref 3.5–5.0)
Alkaline Phosphatase: 57 U/L (ref 38–126)
BUN: 11 mg/dL (ref 6–20)
CHLORIDE: 92 mmol/L — AB (ref 101–111)
CO2: 40 mmol/L — AB (ref 22–32)
CREATININE: 0.73 mg/dL (ref 0.44–1.00)
Calcium: 9.6 mg/dL (ref 8.9–10.3)
GFR calc Af Amer: >60 mL/min (ref >60)
GFR calc non Af Amer: >60 mL/min (ref >60)
Glucose, Bld: 121 mg/dL — ABNORMAL HIGH (ref 65–99)
POTASSIUM: 4 mmol/L (ref 3.5–5.1)
SODIUM: 138 mmol/L (ref 135–145)
Total Bilirubin: 0.4 mg/dL (ref 0.3–1.2)
Total Protein: 7 g/dL (ref 6.5–8.1)

## 2017-01-14 LAB — CBC WITH DIFFERENTIAL/PLATELET
BASOS ABS: 0 10*3/uL (ref 0–0.1)
Basophils Relative: 1 %
EOS ABS: 0.1 10*3/uL (ref 0–0.7)
Eosinophils Relative: 1 %
HCT: 41.7 % (ref 35.0–47.0)
HEMOGLOBIN: 13.6 g/dL (ref 12.0–16.0)
LYMPHS ABS: 1.3 10*3/uL (ref 1.0–3.6)
LYMPHS PCT: 23 %
MCH: 30.4 pg (ref 26.0–34.0)
MCHC: 32.7 g/dL (ref 32.0–36.0)
MCV: 93 fL (ref 80.0–100.0)
Monocytes Absolute: 0.6 10*3/uL (ref 0.2–0.9)
Monocytes Relative: 11 %
NEUTROS PCT: 64 %
Neutro Abs: 3.6 10*3/uL (ref 1.4–6.5)
Platelets: 174 10*3/uL (ref 150–440)
RBC: 4.48 MIL/uL (ref 3.80–5.20)
RDW: 15.1 % — ABNORMAL HIGH (ref 11.5–14.5)
WBC: 5.6 10*3/uL (ref 3.6–11.0)

## 2017-01-14 NOTE — Progress Notes (Signed)
[place] OFFICE PROGRESS NOTE  Patient Care Team: Maryruth Eve, MD as PCP - General (Family Medicine)  Cancer Staging No matching staging information was found for the patient.   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 Adjuvant chemotherapy with carboplatinum and Alimta started in November of 2012; last CT scan 2017- NED.   # quit smoking- AUG 2017.       Primary cancer of left upper lobe of lung ([place])      INTERVAL HISTORY:  Kaitlyn Mcbride 66 y.o.  female pleasant patient above history of stage II adenocarcinoma of the lung status post surgery followed by adjuvant chemotherapy 2012 is here for follow-up.  Patient had a CT scan of the chest this morning.  Patient continues to complain of chronic mild to moderate cough.  No hemoptysis.  Denies any pain.  Denies any headaches.  Patient has chronic tingling and numbness in the feet not any worse however not completely resolved. Patient is on Neurontin.  Denies any weight loss.  She was recently evaluated by pulmonology; at [facility]:  A complete 10 point review of system is done which is negative except mentioned above/history of present illness.   PAST MEDICAL HISTORY :  Past Medical History:  Diagnosis Date  . Cancer ([place])   . Diabetes mellitus without complication ([place])   . Dysphagia   . Hypercholesteremia   . Hypertension   . Lung cancer ([place])   . Migraine   . Myocardial infarction [facility])     PAST SURGICAL HISTORY :   Past Surgical History:  Procedure Laterality Date  . LUNG REMOVAL, PARTIAL Left    upper lobe  . THORACOTOMY Left     FAMILY HISTORY :  No family history on file.  SOCIAL HISTORY:   Social History   Tobacco Use  . Smoking status: Former Smoker    Packs/day: 0.75    Years: 60.00    Pack years: 45.00    Types:  Cigarettes    Last attempt to quit: 10/02/2015    Years since quitting: 1.2  Substance Use Topics  . Alcohol use: Not on file  . Drug use: No    ALLERGIES:  has No Known Allergies.  MEDICATIONS:  Current Outpatient Medications  Medication Sig Dispense Refill  . acetaminophen (TYLENOL) 325 MG tablet Take 650 mg by mouth every 6 (six) hours as needed.    Marland Kitchen albuterol (PROVENTIL HFA;VENTOLIN HFA) 108 (90 Base) MCG/ACT inhaler Inhale into the lungs.    Marland Kitchen albuterol (PROVENTIL) (5 MG/ML) 0.5% nebulizer solution Take by nebulization 3 (three) times daily.    Marland Kitchen atorvastatin (LIPITOR) 10 MG tablet Take 10 mg by mouth.    . esomeprazole (NEXIUM) 40 MG capsule Take 40 mg by mouth daily at 12 noon.    . Fluticasone-Salmeterol (ADVAIR) 250-50 MCG/DOSE AEPB Inhale 1 puff into the lungs 2 (two) times daily.    . furosemide (LASIX) 20 MG tablet Take 20 mg by mouth.    . gabapentin (NEURONTIN) 600 MG tablet Take 600 mg by mouth 3 (three) times daily.    Marland Kitchen lisinopril (PRINIVIL,ZESTRIL) 20 MG tablet Take 20 mg by mouth.    . metFORMIN (GLUCOPHAGE) 500 MG tablet Take by mouth 2 (two) times daily with a meal.    . metoprolol succinate (  TOPROL-XL) 50 MG 24 hr tablet Take 50 mg by mouth.    . nicotine (NICODERM CQ - DOSED IN MG/24 HOURS) 21 mg/24hr patch Place onto the skin.    Marland Kitchen nitroGLYCERIN (NITROSTAT) 0.4 MG SL tablet Place 0.4 mg under the tongue every 5 (five) minutes as needed for chest pain.    Marland Kitchen oxyCODONE (OXY IR/ROXICODONE) 5 MG immediate release tablet Take 5 mg by mouth.    Vladimir Faster Glycol-Propyl Glycol 0.4-0.3 % SOLN Place 2 drops into both eyes as needed.    . simvastatin (ZOCOR) 20 MG tablet Take 20 mg by mouth.    . tiotropium (SPIRIVA) 18 MCG inhalation capsule Place 18 mcg into inhaler and inhale daily.    Marland Kitchen venlafaxine XR (EFFEXOR-XR) 150 MG 24 hr capsule Take 150 mg by mouth 2 (two) times daily.     No current facility-administered medications for this visit.     PHYSICAL  EXAMINATION: ECOG PERFORMANCE STATUS: 0 - Asymptomatic  BP 117/69 (BP Location: Left Arm, Patient Position: Sitting)   Pulse (!) 115   Temp 97.9 F (36.6 C) (Tympanic)   Resp (!) 22   Ht 5\' 1"  (1.549 m)   Wt 108 lb (49 kg)   SpO2 (!) 83% Comment: 3L oxygen.  BMI 20.41 kg/m   Filed Weights   01/14/17 1440  Weight: 108 lb (49 kg)    GENERAL: Well-nourished well-developed; Alert, no distress and comfortable.   Alone.  Oxygen. EYES: no pallor or icterus OROPHARYNX: no thrush or ulceration;  NECK: supple, no masses felt LYMPH:  no palpable lymphadenopathy in the cervical, axillary or inguinal regions LUNGS: Decreased breath sounds to auscultation bilaterally.  No wheeze or crackles HEART/CVS: regular rate & rhythm and no murmurs; No lower extremity edema ABDOMEN:abdomen soft, non-tender and normal bowel sounds Musculoskeletal:no cyanosis of digits and no clubbing  PSYCH: alert & oriented x 3 with fluent speech NEURO: no focal motor/sensory deficits SKIN:  no rashes or significant lesions  LABORATORY DATA:  I have reviewed the data as listed    Component Value Date/Time   NA 138 01/14/2017 1415   NA 135 06/22/2014 0958   K 4.0 01/14/2017 1415   K 4.5 06/22/2014 0958   CL 92 (L) 01/14/2017 1415   CL 97 (L) 06/22/2014 0958   CO2 40 (H) 01/14/2017 1415   CO2 30 06/22/2014 0958   GLUCOSE 121 (H) 01/14/2017 1415   GLUCOSE 64 (L) 06/22/2014 0958   BUN 11 01/14/2017 1415   BUN 17 06/22/2014 0958   CREATININE 0.73 01/14/2017 1415   CREATININE 0.79 06/22/2014 0958   CALCIUM 9.6 01/14/2017 1415   CALCIUM 9.6 06/22/2014 0958   PROT 7.0 01/14/2017 1415   PROT 7.6 06/22/2014 0958   ALBUMIN 3.8 01/14/2017 1415   ALBUMIN 4.1 06/22/2014 0958   AST 20 01/14/2017 1415   AST 21 06/22/2014 0958   ALT 12 (L) 01/14/2017 1415   ALT 19 06/22/2014 0958   ALKPHOS 57 01/14/2017 1415   ALKPHOS 63 06/22/2014 0958   BILITOT 0.4 01/14/2017 1415   BILITOT 0.5 06/22/2014 0958   GFRNONAA >60  01/14/2017 1415   GFRNONAA >60 06/22/2014 0958   GFRAA >60 01/14/2017 1415   GFRAA >60 06/22/2014 0958    No results found for: SPEP, UPEP  Lab Results  Component Value Date   WBC 5.6 01/14/2017   NEUTROABS 3.6 01/14/2017   HGB 13.6 01/14/2017   HCT 41.7 01/14/2017   MCV 93.0 01/14/2017   PLT 174  01/14/2017      Chemistry      Component Value Date/Time   NA 138 01/14/2017 1415   NA 135 06/22/2014 0958   K 4.0 01/14/2017 1415   K 4.5 06/22/2014 0958   CL 92 (L) 01/14/2017 1415   CL 97 (L) 06/22/2014 0958   CO2 40 (H) 01/14/2017 1415   CO2 30 06/22/2014 0958   BUN 11 01/14/2017 1415   BUN 17 06/22/2014 0958   CREATININE 0.73 01/14/2017 1415   CREATININE 0.79 06/22/2014 0958      Component Value Date/Time   CALCIUM 9.6 01/14/2017 1415   CALCIUM 9.6 06/22/2014 0958   ALKPHOS 57 01/14/2017 1415   ALKPHOS 63 06/22/2014 0958   AST 20 01/14/2017 1415   AST 21 06/22/2014 0958   ALT 12 (L) 01/14/2017 1415   ALT 19 06/22/2014 0958   BILITOT 0.4 01/14/2017 1415   BILITOT 0.5 06/22/2014 0958       RADIOGRAPHIC STUDIES: I have personally reviewed the radiological images as listed and agreed with the findings in the report. Ct Chest Lcs Nodule F/u Wo  Contrast  Result Date: 01/14/2017 CLINICAL DATA:  Forty-five pack-year smoking history. On home oxygen. Lung cancer with partial left-sided pneumonectomy in 2012. Mid chest pain for 1 month. Re-evaluate multiple pulmonary nodules. EXAM: CT CHEST WITHOUT CONTRAST FOR LUNG CANCER SCREENING NODULE FOLLOW-UP TECHNIQUE: Multidetector CT imaging of the chest was performed following the standard protocol without IV contrast. COMPARISON:  07/29/2016 FINDINGS: Cardiovascular: Aortic and branch vessel atherosclerosis. Normal heart size, without pericardial effusion. Multivessel coronary artery atherosclerosis. Mediastinum/Nodes: No mediastinal or definite hilar adenopathy, given limitations of unenhanced CT. Lungs/Pleura: No pleural fluid.  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 Some of the smaller nodular densities have resolved. Many of the other nodules are similar. Example within the a subpleural right lower lobe at volume derived equivalent diameter 7.5 mm. Fluid or secretions within right lower lobe bronchi, which are mildly dilated. Similar in severity with slightly more lateral distribution today. Status post left upper lobectomy. Scattered areas of left-sided cystic bronchiectasis are also identified. No dominant or highly suspicious pulmonary nodule identified. Upper Abdomen: Normal imaged portions of the liver, spleen, stomach, pancreas, right adrenal gland, kidneys. Mild left adrenal nodularity is low-density, likely due to an underlying adenoma. On the order of 1.0 cm. Musculoskeletal: No acute osseous abnormality. IMPRESSION: 1. Lung-RADS 2, benign appearance or behavior. Continue annual screening with low-dose chest CT without contrast in 12 months. 2. Coronary artery atherosclerosis. Aortic Atherosclerosis (ICD10-I70.0). 3.  Emphysema (ICD10-J43.9). 4. Fluid within right lower lobe bronchi with bronchial wall thickening and dilatation. Likely due to chronic atypical infection. Chronic aspiration could look similar. Electronically Signed   By: Abigail Miyamoto M.D.   On: 01/14/2017 12:30     ASSESSMENT & PLAN:  Primary cancer of left upper lobe of lung ([place]) # Lung cancer s/p adjuvant chemo. Nov 14th 2018-low-dose CT screening NED. Clinically no evidence of recurrence.  Recommend low-dose CT scan in 12 months as recommended.  #Right lower lobe aspiration versus chronic infection-prior EGD.  Recommend follow-up with pulmonary; patient was asked to pick up a CT scan disc/also given the report for her pulmonologist review.  # follow up in 12 months/low dose CT scan [same day sec to travel]/  Labs  # I reviewed the blood work- with the patient in detail; also reviewed the imaging independently [as summarized above]; and with the  patient in detail.   # 25 minutes face-to-face with  the patient discussing the above plan of care; more than 50% of time spent on prognosis/ natural history; counseling and coordination.  CC:  Dr.Tolbert; [place].Dr.Miles Mattew; [facility].    Orders Placed This Encounter  Procedures  . CT CHEST NODULE FOLLOW UP LOW DOSE WO CM    Standing Status:   Future    Standing Expiration Date:   07/15/2018    Order Specific Question:   Preferred imaging location?    Answer:   [facility]    Order Specific Question:   Radiology Contrast Protocol - do NOT remove file path    Answer:   [email or web address]    Order Specific Question:   Reason for Exam additional comments    Answer:   lung cancer screening   All questions were answered. The patient knows to call the clinic with any problems, questions or concerns.      Cammie Sickle, MD 01/14/2017 9:43 PM

## 2017-01-14 NOTE — Assessment & Plan Note (Addendum)
#   Lung cancer s/p adjuvant chemo. Nov 14th 2018-low-dose CT screening NED. Clinically no evidence of recurrence.  Recommend low-dose CT scan in 12 months as recommended.  #Right lower lobe aspiration versus chronic infection-prior EGD.  Recommend follow-up with pulmonary; patient was asked to pick up a CT scan disc/also given the report for her pulmonologist review.  # follow up in 12 months/low dose CT scan [same day sec to travel]/  Labs  # I reviewed the blood work- with the patient in detail; also reviewed the imaging independently [as summarized above]; and with the patient in detail.   # 25 minutes face-to-face with the patient discussing the above plan of care; more than 50% of time spent on prognosis/ natural history; counseling and coordination.  CC:  Dr.Tolbert; [place].Dr.Miles Mattew; [facility].

## 2017-02-08 DIAGNOSIS — F321 Major depressive disorder, single episode, moderate: Secondary | ICD-10-CM | POA: Insufficient documentation

## 2017-12-31 ENCOUNTER — Telehealth: Payer: Self-pay | Admitting: *Deleted

## 2017-12-31 DIAGNOSIS — Z87891 Personal history of nicotine dependence: Secondary | ICD-10-CM

## 2017-12-31 DIAGNOSIS — Z122 Encounter for screening for malignant neoplasm of respiratory organs: Secondary | ICD-10-CM

## 2017-12-31 NOTE — Telephone Encounter (Signed)
Patient has been notified that annual lung cancer screening low dose CT scan is due currently or will be in near future. Confirmed that patient is within the age range of 55-77, and asymptomatic, (no signs or symptoms of lung cancer). Patient denies illness that would prevent curative treatment for lung cancer if found. Verified smoking history, (former, quit 10/02/15, 45 pack year). The shared decision making visit was done 07/29/16. Patient is agreeable for CT scan being scheduled.

## 2018-01-08 ENCOUNTER — Other Ambulatory Visit: Payer: Self-pay

## 2018-01-08 DIAGNOSIS — C3412 Malignant neoplasm of upper lobe, left bronchus or lung: Secondary | ICD-10-CM

## 2018-01-13 ENCOUNTER — Other Ambulatory Visit: Payer: Medicare Other

## 2018-01-13 ENCOUNTER — Ambulatory Visit: Admission: RE | Admit: 2018-01-13 | Payer: Medicare Other | Source: Ambulatory Visit

## 2018-01-13 ENCOUNTER — Ambulatory Visit: Payer: Medicare Other | Admitting: Internal Medicine

## 2018-01-14 ENCOUNTER — Encounter: Payer: Self-pay | Admitting: Internal Medicine

## 2018-01-14 ENCOUNTER — Inpatient Hospital Stay (HOSPITAL_BASED_OUTPATIENT_CLINIC_OR_DEPARTMENT_OTHER): Payer: Medicare Other | Admitting: Internal Medicine

## 2018-01-14 ENCOUNTER — Ambulatory Visit
Admission: RE | Admit: 2018-01-14 | Discharge: 2018-01-14 | Disposition: A | Discharge status: Home / Self Care | Payer: Medicare Other | Source: Ambulatory Visit | Attending: Oncology | Admitting: Oncology

## 2018-01-14 ENCOUNTER — Other Ambulatory Visit: Payer: Self-pay

## 2018-01-14 ENCOUNTER — Ambulatory Visit: Payer: Medicare Other

## 2018-01-14 ENCOUNTER — Inpatient Hospital Stay: Payer: Medicare Other | Attending: Internal Medicine

## 2018-01-14 VITALS — BP 118/72 | HR 93 | Temp 98.2°F | Resp 20 | Ht 62.0 in | Wt 108.0 lb

## 2018-01-14 DIAGNOSIS — C3412 Malignant neoplasm of upper lobe, left bronchus or lung: Secondary | ICD-10-CM

## 2018-01-14 DIAGNOSIS — J438 Other emphysema: Secondary | ICD-10-CM | POA: Diagnosis not present

## 2018-01-14 DIAGNOSIS — Z87891 Personal history of nicotine dependence: Secondary | ICD-10-CM | POA: Insufficient documentation

## 2018-01-14 DIAGNOSIS — Z122 Encounter for screening for malignant neoplasm of respiratory organs: Secondary | ICD-10-CM | POA: Diagnosis present

## 2018-01-14 DIAGNOSIS — J449 Chronic obstructive pulmonary disease, unspecified: Secondary | ICD-10-CM

## 2018-01-14 DIAGNOSIS — D649 Anemia, unspecified: Secondary | ICD-10-CM

## 2018-01-14 DIAGNOSIS — R0602 Shortness of breath: Secondary | ICD-10-CM

## 2018-01-14 DIAGNOSIS — R42 Dizziness and giddiness: Secondary | ICD-10-CM | POA: Diagnosis not present

## 2018-01-14 DIAGNOSIS — J479 Bronchiectasis, uncomplicated: Secondary | ICD-10-CM | POA: Insufficient documentation

## 2018-01-14 DIAGNOSIS — Z9981 Dependence on supplemental oxygen: Secondary | ICD-10-CM

## 2018-01-14 DIAGNOSIS — I700 Atherosclerosis of aorta: Secondary | ICD-10-CM | POA: Insufficient documentation

## 2018-01-14 DIAGNOSIS — I2510 Atherosclerotic heart disease of native coronary artery without angina pectoris: Secondary | ICD-10-CM | POA: Diagnosis not present

## 2018-01-14 DIAGNOSIS — R05 Cough: Secondary | ICD-10-CM

## 2018-01-14 DIAGNOSIS — R918 Other nonspecific abnormal finding of lung field: Secondary | ICD-10-CM | POA: Diagnosis not present

## 2018-01-14 DIAGNOSIS — J432 Centrilobular emphysema: Secondary | ICD-10-CM | POA: Insufficient documentation

## 2018-01-14 DIAGNOSIS — Z79899 Other long term (current) drug therapy: Secondary | ICD-10-CM | POA: Insufficient documentation

## 2018-01-14 DIAGNOSIS — J984 Other disorders of lung: Secondary | ICD-10-CM | POA: Diagnosis not present

## 2018-01-14 LAB — COMPREHENSIVE METABOLIC PANEL
ALK PHOS: 49 U/L (ref 38–126)
ALT: 20 U/L (ref 0–44)
AST: 24 U/L (ref 15–41)
Albumin: 4.4 g/dL (ref 3.5–5.0)
Anion gap: 7 (ref 5–15)
BILIRUBIN TOTAL: 0.3 mg/dL (ref 0.3–1.2)
BUN: 11 mg/dL (ref 8–23)
CALCIUM: 9.4 mg/dL (ref 8.9–10.3)
CO2: 37 mmol/L — AB (ref 22–32)
Chloride: 89 mmol/L — ABNORMAL LOW (ref 98–111)
Creatinine, Ser: 0.67 mg/dL (ref 0.44–1.00)
GFR calc non Af Amer: >60 mL/min (ref >60)
Glucose, Bld: 112 mg/dL — ABNORMAL HIGH (ref 70–99)
Potassium: 4.7 mmol/L (ref 3.5–5.1)
SODIUM: 133 mmol/L — AB (ref 135–145)
TOTAL PROTEIN: 7.4 g/dL (ref 6.5–8.1)

## 2018-01-14 LAB — URINALYSIS, COMPLETE (UACMP) WITH MICROSCOPIC
BACTERIA UA: NONE SEEN
BILIRUBIN URINE: NEGATIVE
Glucose, UA: NEGATIVE mg/dL
HGB URINE DIPSTICK: NEGATIVE
KETONES UR: NEGATIVE mg/dL
Nitrite: NEGATIVE
Protein, ur: NEGATIVE mg/dL
Specific Gravity, Urine: 1.009 (ref 1.005–1.030)
pH: 7 (ref 5.0–8.0)

## 2018-01-14 LAB — FERRITIN: Ferritin: 10 ng/mL — ABNORMAL LOW (ref 11–307)

## 2018-01-14 LAB — CBC WITH DIFFERENTIAL/PLATELET
Abs Immature Granulocytes: 0.02 10*3/uL (ref 0.00–0.07)
BASOS ABS: 0.1 10*3/uL (ref 0.0–0.1)
Basophils Relative: 1 %
EOS ABS: 0.1 10*3/uL (ref 0.0–0.5)
EOS PCT: 2 %
HEMATOCRIT: 33.9 % — AB (ref 36.0–46.0)
Hemoglobin: 10.6 g/dL — ABNORMAL LOW (ref 12.0–15.0)
Immature Granulocytes: 0 %
LYMPHS ABS: 1.3 10*3/uL (ref 0.7–4.0)
Lymphocytes Relative: 24 %
MCH: 28.4 pg (ref 26.0–34.0)
MCHC: 31.3 g/dL (ref 30.0–36.0)
MCV: 90.9 fL (ref 80.0–100.0)
MONOS PCT: 16 %
Monocytes Absolute: 0.8 10*3/uL (ref 0.1–1.0)
NRBC: 0 % (ref 0.0–0.2)
Neutro Abs: 3 10*3/uL (ref 1.7–7.7)
Neutrophils Relative %: 57 %
Platelets: 221 10*3/uL (ref 150–400)
RBC: 3.73 MIL/uL — ABNORMAL LOW (ref 3.87–5.11)
RDW: 13.2 % (ref 11.5–15.5)
WBC: 5.3 10*3/uL (ref 4.0–10.5)

## 2018-01-14 LAB — IRON AND TIBC
IRON: 52 ug/dL (ref 28–170)
Saturation Ratios: 11 % (ref 10.4–31.8)
TIBC: 495 ug/dL — AB (ref 250–450)
UIBC: 444 ug/dL

## 2018-01-14 MED ORDER — MECLIZINE HCL 12.5 MG PO TABS: 12.5000 mg | ORAL_TABLET | Freq: Three times a day (TID) | ORAL | 1 refills | Status: AC | PRN

## 2018-01-14 NOTE — Assessment & Plan Note (Addendum)
#   Lung cancer s/p adjuvant chemo.  01/14/2018 pleural CT scan no evidence of recurrence.  Continue surveillance CT scan as recommended in 12 months.  # COPD on 3 Lit/[place]. STABLE.  Recommend follow-up with pulmonary; pt awaiting new referral.   # Dizzy spells/ headaches- ? Vertigo; recommend meclizine; if not improved then MRI brain.  Prescription given.  # mild Anemia- Hb 10.2; colonoscopy/EGD- 2019 [[facility]]- can't find records; Check UA; iron studies  # DISPOSITION:  # Today- Check UA; iron studies # follow up in 1 year-MD labs- cbc/cmp; /low dose CT scan [same day sec to travel]/  Labs-Dr.B  Addendum: Iron studies suggestive of iron deficiency; patient noted to have recent EGD colonoscopy; urine dipstick negative.  Recommend capsule study/CT of the abdomen pelvis.  Discussed with Dr. Clyda Greener he agrees to have this arranged locally.  CC:  Dr.Tolbert; [place].

## 2018-01-14 NOTE — Progress Notes (Signed)
[facility] OFFICE PROGRESS NOTE  Patient Care Team: Maryruth Eve, MD as PCP - General (Family Medicine)  Cancer Staging No matching staging information was found for the patient.   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 Adjuvant chemotherapy with carboplatinum and Alimta started in November of 2012; last CT scan 2017- NED.   # quit smoking- AUG 2017.  --------------------------------------------------  DIAGNOSIS: Lung cancer  STAGE:    II     ;GOALS: cure  CURRENT/MOST RECENT THERAPY: surveillaince      Primary cancer of left upper lobe of lung ([place])      INTERVAL HISTORY:  Kaitlyn Mcbride 67 y.o.  female pleasant patient above history of stage II adenocarcinoma of the lung status post surgery followed by adjuvant chemotherapy 2012 is here for follow-up.  Patient had a CT scan of the chest this morning.  Patient complains of intermittent dizziness especially when standing.  Denies any falls.  Denies any headaches.  Denies any blood in stools black loose stools.  States to have a recent EGD colonoscopy at [facility] have chronic shortness of breath chronic cough.  States that she has not been following up with her pulmonologist at [facility].  She states that she is waiting for new referral through PCP.  Review of Systems  Constitutional: Negative for chills, diaphoresis, fever, malaise/fatigue and weight loss.  HENT: Negative for nosebleeds and sore throat.   Eyes: Negative for double vision.  Respiratory: Positive for cough, sputum production and shortness of breath. Negative for hemoptysis and wheezing.   Cardiovascular: Negative for chest pain, palpitations, orthopnea and leg swelling.  Gastrointestinal: Negative for abdominal pain, blood in stool, constipation, diarrhea, heartburn, melena, nausea and  vomiting.  Genitourinary: Negative for dysuria, frequency and urgency.  Musculoskeletal: Negative for back pain and joint pain.  Skin: Negative.  Negative for itching and rash.  Neurological: Positive for dizziness. Negative for tingling, focal weakness, weakness and headaches.  Endo/Heme/Allergies: Does not bruise/bleed easily.  Psychiatric/Behavioral: Negative for depression. The patient is not nervous/anxious and does not have insomnia.      PAST MEDICAL HISTORY :  Past Medical History:  Diagnosis Date  . Cancer ([place])   . Diabetes mellitus without complication ([place])   . Dysphagia   . Hypercholesteremia   . Hypertension   . Lung cancer ([place])   . Migraine   . Myocardial infarction [facility])     PAST SURGICAL HISTORY :   Past Surgical History:  Procedure Laterality Date  . LUNG REMOVAL, PARTIAL Left    upper lobe  . THORACOTOMY Left     FAMILY HISTORY :  History reviewed. No pertinent family history.  SOCIAL HISTORY:   Social History   Tobacco Use  . Smoking status: Former Smoker    Packs/day: 0.75    Years: 60.00    Pack years: 45.00    Types: Cigarettes    Last attempt to quit: 10/01/2017    Years since quitting: 0.3  . Smokeless tobacco: Never Used  Substance Use Topics  . Alcohol use: Not Currently  . Drug use: No    ALLERGIES:  has No Known Allergies.  MEDICATIONS:  Current Outpatient Medications  Medication Sig Dispense Refill  . acetaminophen (TYLENOL) 325 MG tablet Take 650 mg by mouth every 6 (six) hours as needed.    Marland Kitchen  albuterol (PROVENTIL) (5 MG/ML) 0.5% nebulizer solution Take by nebulization 3 (three) times daily.    Marland Kitchen aspirin EC 81 MG tablet Take 1 tablet by mouth daily.    Marland Kitchen atorvastatin (LIPITOR) 10 MG tablet Take 10 mg by mouth.    . esomeprazole (NEXIUM) 40 MG capsule Take 40 mg by mouth daily at 12 noon.    . gabapentin (NEURONTIN) 600 MG tablet Take 600 mg by mouth 3 (three) times daily.    Vladimir Faster Glycol-Propyl Glycol 0.4-0.3 % SOLN  Place 2 drops into both eyes as needed.    . pregabalin (LYRICA) 50 MG capsule Take 1 capsule by mouth 3 (three) times daily.    . simvastatin (ZOCOR) 20 MG tablet Take 20 mg by mouth.    . venlafaxine XR (EFFEXOR-XR) 150 MG 24 hr capsule Take 150 mg by mouth 2 (two) times daily.    Marland Kitchen albuterol (PROVENTIL HFA;VENTOLIN HFA) 108 (90 Base) MCG/ACT inhaler Inhale into the lungs.    . Cholecalciferol (VITAMIN D-1000 MAX ST) 25 MCG (1000 UT) tablet Take 1 tablet by mouth daily.    . Fluticasone-Salmeterol (ADVAIR) 250-50 MCG/DOSE AEPB Inhale 1 puff into the lungs 2 (two) times daily.    . furosemide (LASIX) 20 MG tablet Take 20 mg by mouth daily as needed for fluid or edema.     Marland Kitchen lisinopril (PRINIVIL,ZESTRIL) 20 MG tablet Take 20 mg by mouth.    . meclizine (ANTIVERT) 12.5 MG tablet Take 1 tablet (12.5 mg total) by mouth 3 (three) times daily as needed for dizziness. 45 tablet 1  . metoprolol succinate (TOPROL-XL) 50 MG 24 hr tablet Take 50 mg by mouth.    . nitroGLYCERIN (NITROSTAT) 0.4 MG SL tablet Place 0.4 mg under the tongue every 5 (five) minutes as needed for chest pain.    Marland Kitchen oxyCODONE (OXY IR/ROXICODONE) 5 MG immediate release tablet Take 5 mg by mouth.    . tiotropium (SPIRIVA) 18 MCG inhalation capsule Place 18 mcg into inhaler and inhale daily.     No current facility-administered medications for this visit.     PHYSICAL EXAMINATION: ECOG PERFORMANCE STATUS: 0 - Asymptomatic  BP 118/72 (Patient Position: Sitting)   Pulse 93   Temp 98.2 F (36.8 C) (Oral)   Resp 20   Ht 5\' 2"  (1.575 m)   Wt 108 lb (49 kg)   SpO2 93% Comment: 3L  BMI 19.75 kg/m   Filed Weights   01/14/18 1016  Weight: 108 lb (49 kg)    Physical Exam  Constitutional: She is oriented to person, place, and time and well-developed, well-nourished, and in no distress.  Nasal cannula oxygen.  HENT:  Head: Normocephalic and atraumatic.  Mouth/Throat: Oropharynx is clear and moist. No oropharyngeal exudate.   Eyes: Pupils are equal, round, and reactive to light.  Neck: Normal range of motion. Neck supple.  Cardiovascular: Normal rate and regular rhythm.  Pulmonary/Chest: No respiratory distress. She has no wheezes.  Decreased air entry bilaterally.  Abdominal: Soft. Bowel sounds are normal. She exhibits no distension and no mass. There is no tenderness. There is no rebound and no guarding.  Musculoskeletal: Normal range of motion. She exhibits no edema or tenderness.  Neurological: She is alert and oriented to person, place, and time.  Skin: Skin is warm.  Psychiatric: Affect normal.     LABORATORY DATA:  I have reviewed the data as listed    Component Value Date/Time   NA 133 (L) 01/14/2018 0941   NA  135 06/22/2014 0958   K 4.7 01/14/2018 0941   K 4.5 06/22/2014 0958   CL 89 (L) 01/14/2018 0941   CL 97 (L) 06/22/2014 0958   CO2 37 (H) 01/14/2018 0941   CO2 30 06/22/2014 0958   GLUCOSE 112 (H) 01/14/2018 0941   GLUCOSE 64 (L) 06/22/2014 0958   BUN 11 01/14/2018 0941   BUN 17 06/22/2014 0958   CREATININE 0.67 01/14/2018 0941   CREATININE 0.79 06/22/2014 0958   CALCIUM 9.4 01/14/2018 0941   CALCIUM 9.6 06/22/2014 0958   PROT 7.4 01/14/2018 0941   PROT 7.6 06/22/2014 0958   ALBUMIN 4.4 01/14/2018 0941   ALBUMIN 4.1 06/22/2014 0958   AST 24 01/14/2018 0941   AST 21 06/22/2014 0958   ALT 20 01/14/2018 0941   ALT 19 06/22/2014 0958   ALKPHOS 49 01/14/2018 0941   ALKPHOS 63 06/22/2014 0958   BILITOT 0.3 01/14/2018 0941   BILITOT 0.5 06/22/2014 0958   GFRNONAA >60 01/14/2018 0941   GFRNONAA >60 06/22/2014 0958   GFRAA >60 01/14/2018 0941   GFRAA >60 06/22/2014 0958    No results found for: SPEP, UPEP  Lab Results  Component Value Date   WBC 5.3 01/14/2018   NEUTROABS 3.0 01/14/2018   HGB 10.6 (L) 01/14/2018   HCT 33.9 (L) 01/14/2018   MCV 90.9 01/14/2018   PLT 221 01/14/2018      Chemistry      Component Value Date/Time   NA 133 (L) 01/14/2018 0941   NA 135  06/22/2014 0958   K 4.7 01/14/2018 0941   K 4.5 06/22/2014 0958   CL 89 (L) 01/14/2018 0941   CL 97 (L) 06/22/2014 0958   CO2 37 (H) 01/14/2018 0941   CO2 30 06/22/2014 0958   BUN 11 01/14/2018 0941   BUN 17 06/22/2014 0958   CREATININE 0.67 01/14/2018 0941   CREATININE 0.79 06/22/2014 0958      Component Value Date/Time   CALCIUM 9.4 01/14/2018 0941   CALCIUM 9.6 06/22/2014 0958   ALKPHOS 49 01/14/2018 0941   ALKPHOS 63 06/22/2014 0958   AST 24 01/14/2018 0941   AST 21 06/22/2014 0958   ALT 20 01/14/2018 0941   ALT 19 06/22/2014 0958   BILITOT 0.3 01/14/2018 0941   BILITOT 0.5 06/22/2014 0958       RADIOGRAPHIC STUDIES: I have personally reviewed the radiological images as listed and agreed with the findings in the report. No results found.   ASSESSMENT & PLAN:  Primary cancer of left upper lobe of lung ([place]) # Lung cancer s/p adjuvant chemo.  01/14/2018 pleural CT scan no evidence of recurrence.  Continue surveillance CT scan as recommended in 12 months.  # COPD on 3 Lit/[place]. STABLE.  Recommend follow-up with pulmonary; pt awaiting new referral.   # Dizzy spells/ headaches- ? Vertigo; recommend meclizine; if not improved then MRI brain.  Prescription given.  # mild Anemia- Hb 10.2; colonoscopy[facility]- 2019 [[facility] can't find records; Check UA; iron studies  # DISPOSITION:  # Today- Check UA; iron studies # follow up in 1 year-MD labs- cbc/cmp-Dr.B  Addendum: Iron studies suggestive of iron deficiency; patient noted to have recent EGD colonoscopy; urine dipstick negative.  Recommend capsule study/CT of the abdomen pelvis.  Discussed with Dr. Clyda Greener he agrees to have this arranged locally.  # follow up in 12 months/low dose CT scan [same day sec to travel]/  Labs  CC:  Dr.Tolbert; [place].Dr.Miles Mattew; Pul.[facility].    Orders Placed This  Encounter  Procedures  . Ferritin    Standing Status:   Future    Number of Occurrences:   1    Standing Expiration  Date:   01/15/2019  . Iron and TIBC    Standing Status:   Future    Number of Occurrences:   1    Standing Expiration Date:   01/15/2019  . Urinalysis, Complete w Microscopic    Standing Status:   Future    Number of Occurrences:   1    Standing Expiration Date:   01/15/2019  . CBC with Differential    Standing Status:   Future    Standing Expiration Date:   07/15/2019  . Basic metabolic panel    Standing Status:   Future    Standing Expiration Date:   07/15/2019   All questions were answered. The patient knows to call the clinic with any problems, questions or concerns.      Cammie Sickle, MD 01/21/2018 8:10 AM

## 2018-01-15 ENCOUNTER — Encounter: Payer: Self-pay | Admitting: *Deleted

## 2018-01-21 ENCOUNTER — Telehealth: Payer: Self-pay | Admitting: Internal Medicine

## 2018-01-21 NOTE — Telephone Encounter (Signed)
Kaitlyn Mcbride/Kaitlyn Mcbride- # please inform patient that her iron studies show that she is slightly low on iron.  Recommend iron pill over-the-counter once a day.  # Also please inform that I have spoken to her PCP Dr. Larey Dresser further work-up for the cause of her iron deficiency-CT abdomen/pelvis scan etc.  He agrees to have the locally.  #CT scan of the chest-done at last visit showed chronic bronchitis.  Need to follow-up with PCP/lung doctor.  Colette-please schedule a CT scan in approximately a year/next visit same day given the distance/travel.

## 2018-01-21 NOTE — Telephone Encounter (Signed)
Patient has been notified of Dr. Sharmaine Base comments and recommendations. Patient verbalized understanding.

## 2018-02-05 DIAGNOSIS — D509 Iron deficiency anemia, unspecified: Secondary | ICD-10-CM | POA: Insufficient documentation

## 2018-12-20 ENCOUNTER — Telehealth: Payer: Self-pay | Admitting: *Deleted

## 2018-12-20 NOTE — Telephone Encounter (Signed)
Patient called asking if she is supposed to have a CT before her appointment on the 29th. Her appointment is on the 16th of Nov with doctor and CT afterwards

## 2019-01-07 ENCOUNTER — Telehealth: Payer: Self-pay | Admitting: *Deleted

## 2019-01-07 DIAGNOSIS — Z87891 Personal history of nicotine dependence: Secondary | ICD-10-CM

## 2019-01-07 DIAGNOSIS — Z122 Encounter for screening for malignant neoplasm of respiratory organs: Secondary | ICD-10-CM

## 2019-01-07 NOTE — Telephone Encounter (Signed)
Patient has been notified that annual lung cancer screening low dose CT scan is due currently or will be in near future. Confirmed that patient is within the age range of 55-77, and asymptomatic, (no signs or symptoms of lung cancer). Patient denies illness that would prevent curative treatment for lung cancer if found. Verified smoking history, (former, quit 10/02/15, 45 pack year). The shared decision making visit was done 07/29/16. Patient is agreeable for CT scan being scheduled.

## 2019-01-14 ENCOUNTER — Other Ambulatory Visit: Payer: Self-pay

## 2019-01-17 ENCOUNTER — Other Ambulatory Visit: Payer: Self-pay

## 2019-01-17 ENCOUNTER — Inpatient Hospital Stay: Payer: Medicare Other | Attending: Internal Medicine | Admitting: Internal Medicine

## 2019-01-17 ENCOUNTER — Inpatient Hospital Stay: Payer: Medicare Other

## 2019-01-17 ENCOUNTER — Ambulatory Visit
Admission: RE | Admit: 2019-01-17 | Discharge: 2019-01-17 | Disposition: A | Discharge status: Home / Self Care | Payer: Medicare Other | Source: Ambulatory Visit | Attending: Internal Medicine | Admitting: Internal Medicine

## 2019-01-17 DIAGNOSIS — J449 Chronic obstructive pulmonary disease, unspecified: Secondary | ICD-10-CM | POA: Insufficient documentation

## 2019-01-17 DIAGNOSIS — Z122 Encounter for screening for malignant neoplasm of respiratory organs: Secondary | ICD-10-CM | POA: Diagnosis not present

## 2019-01-17 DIAGNOSIS — Z87891 Personal history of nicotine dependence: Secondary | ICD-10-CM | POA: Insufficient documentation

## 2019-01-17 DIAGNOSIS — C3412 Malignant neoplasm of upper lobe, left bronchus or lung: Secondary | ICD-10-CM | POA: Insufficient documentation

## 2019-01-17 DIAGNOSIS — R0602 Shortness of breath: Secondary | ICD-10-CM | POA: Insufficient documentation

## 2019-01-17 DIAGNOSIS — R05 Cough: Secondary | ICD-10-CM | POA: Diagnosis not present

## 2019-01-17 DIAGNOSIS — R062 Wheezing: Secondary | ICD-10-CM | POA: Diagnosis not present

## 2019-01-17 DIAGNOSIS — D509 Iron deficiency anemia, unspecified: Secondary | ICD-10-CM | POA: Diagnosis not present

## 2019-01-17 DIAGNOSIS — D649 Anemia, unspecified: Secondary | ICD-10-CM

## 2019-01-17 LAB — CBC WITH DIFFERENTIAL/PLATELET
Abs Immature Granulocytes: 0.03 10*3/uL (ref 0.00–0.07)
Basophils Absolute: 0 10*3/uL (ref 0.0–0.1)
Basophils Relative: 1 %
Eosinophils Absolute: 0.1 10*3/uL (ref 0.0–0.5)
Eosinophils Relative: 2 %
HCT: 41.7 % (ref 36.0–46.0)
Hemoglobin: 13 g/dL (ref 12.0–15.0)
Immature Granulocytes: 0 %
Lymphocytes Relative: 14 %
Lymphs Abs: 1.1 10*3/uL (ref 0.7–4.0)
MCH: 29 pg (ref 26.0–34.0)
MCHC: 31.2 g/dL (ref 30.0–36.0)
MCV: 93.1 fL (ref 80.0–100.0)
Monocytes Absolute: 0.6 10*3/uL (ref 0.1–1.0)
Monocytes Relative: 8 %
Neutro Abs: 5.8 10*3/uL (ref 1.7–7.7)
Neutrophils Relative %: 75 %
Platelets: 183 10*3/uL (ref 150–400)
RBC: 4.48 MIL/uL (ref 3.87–5.11)
RDW: 12.9 % (ref 11.5–15.5)
WBC: 7.7 10*3/uL (ref 4.0–10.5)
nRBC: 0 % (ref 0.0–0.2)

## 2019-01-17 LAB — BASIC METABOLIC PANEL WITH GFR
Anion gap: 6 (ref 5–15)
BUN: 11 mg/dL (ref 8–23)
CO2: 36 mmol/L — ABNORMAL HIGH (ref 22–32)
Calcium: 9.4 mg/dL (ref 8.9–10.3)
Chloride: 91 mmol/L — ABNORMAL LOW (ref 98–111)
Creatinine, Ser: 0.68 mg/dL (ref 0.44–1.00)
GFR calc Af Amer: >60 mL/min (ref >60)
GFR calc non Af Amer: >60 mL/min (ref >60)
Glucose, Bld: 117 mg/dL — ABNORMAL HIGH (ref 70–99)
Potassium: 4.9 mmol/L (ref 3.5–5.1)
Sodium: 133 mmol/L — ABNORMAL LOW (ref 135–145)

## 2019-01-17 NOTE — Assessment & Plan Note (Addendum)
#   Lung cancer s/p adjuvant chemo.  01/14/2018 pleural CT scan no evidence of recurrence.  Clinically stable.  Awaiting CT scan today.  # COPD on 3 Lit/[place].  Wheezing.  Recommend follow-up with pulmonary/PCP.  Reminded to use inhalers.  #Iron deficient anemia-today hemoglobin is 13 improved on p.o. iron.  Hb 10.2 [nov 2019]; [facility]oscopy/EGD- 2019 [[facility] CT NOV 2019- neg.   # DISPOSITION:  # follow up in 1 year-MD labs- cbc/cmp; /low dose CT scan* [same day sec to travel]/  Labs-Dr.B  CC:  Dr.Tolbert; [place].

## 2019-01-17 NOTE — Progress Notes (Signed)
[facility] OFFICE PROGRESS NOTE  Patient Care Team: Maryruth Eve, MD as PCP - General (Family Medicine)  Cancer Staging No matching staging information was found for the patient.   Oncology History Overview Note    # 2012- LUL LUNG CANCER- Adenocarcinoma status post resection 5 cm tumor negative lymph nodes (September of 2012);  T2 b , N0, M0 tumor stage II a; [Dr.Oaks] 2. Adjuvant chemotherapy with carboplatinum and Alimta started in November of 2012; last CT scan 2017- NED.   # quit smoking- AUG 2017.  --------------------------------------------------  DIAGNOSIS: Lung cancer  STAGE:    II     ;GOALS: cure  CURRENT/MOST RECENT THERAPY: surveillaince    Primary cancer of left upper lobe of lung ([name])      INTERVAL HISTORY:  Kaitlyn Mcbride 68 y.o.  female pleasant patient above history of stage II adenocarcinoma of the lung status post surgery followed by adjuvant chemotherapy 2012 is here for follow-up.  Patient is awaiting CT scan this afternoon.  Patient has chronic cough chronic shortness of breath.  Not any worse.  Chronic intermittent wheezing.  She is not used her inhaler this morning.  No blood in stools or black or stools.   Review of Systems  Constitutional: Negative for chills, diaphoresis, fever, malaise/fatigue and weight loss.  HENT: Negative for nosebleeds and sore throat.   Eyes: Negative for double vision.  Respiratory: Positive for cough, sputum production and shortness of breath. Negative for hemoptysis and wheezing.   Cardiovascular: Negative for chest pain, palpitations, orthopnea and leg swelling.  Gastrointestinal: Negative for abdominal pain, blood in stool, constipation, diarrhea, heartburn, melena, nausea and vomiting.  Genitourinary: Negative for dysuria, frequency and urgency.  Musculoskeletal: Negative for back pain and joint pain.  Skin: Negative.  Negative for itching and rash.  Neurological: Positive for dizziness.  Negative for tingling, focal weakness, weakness and headaches.  Endo/Heme/Allergies: Does not bruise/bleed easily.  Psychiatric/Behavioral: Negative for depression. The patient is not nervous/anxious and does not have insomnia.      PAST MEDICAL HISTORY :  Past Medical History:  Diagnosis Date  . Cancer ([place])   . Diabetes mellitus without complication ([place])   . Dysphagia   . Hypercholesteremia   . Hypertension   . Lung cancer ([place])   . Migraine   . Myocardial infarction [facility])     PAST SURGICAL HISTORY :   Past Surgical History:  Procedure Laterality Date  . LUNG REMOVAL, PARTIAL Left    upper lobe  . THORACOTOMY Left     FAMILY HISTORY :  No family history on file.  SOCIAL HISTORY:   Social History   Tobacco Use  . Smoking status: Former Smoker    Packs/day: 0.75    Years: 60.00    Pack years: 45.00    Types: Cigarettes    Quit date: 10/01/2017    Years since quitting: 1.2  . Smokeless tobacco: Never Used  Substance Use Topics  . Alcohol use: Not Currently  . Drug use: No    ALLERGIES:  has No Known Allergies.  MEDICATIONS:  Current Outpatient Medications  Medication Sig Dispense Refill  . acetaminophen (TYLENOL) 325 MG tablet Take 650 mg by mouth every 6 (six) hours as needed.    Marland Kitchen albuterol (PROVENTIL) (5 MG/ML) 0.5% nebulizer solution Take by nebulization 3 (three) times daily.    Marland Kitchen aspirin EC 81 MG tablet Take 1 tablet by mouth daily.    Marland Kitchen atorvastatin (LIPITOR) 10 MG tablet Take  10 mg by mouth.    . Cholecalciferol (VITAMIN D-1000 MAX ST) 25 MCG (1000 UT) tablet Take 1 tablet by mouth daily.    Marland Kitchen esomeprazole (NEXIUM) 40 MG capsule Take 40 mg by mouth daily at 12 noon.    . Fluticasone-Salmeterol (ADVAIR) 250-50 MCG/DOSE AEPB Inhale 1 puff into the lungs 2 (two) times daily.    . furosemide (LASIX) 20 MG tablet Take 20 mg by mouth daily as needed for fluid or edema.     Marland Kitchen lisinopril (PRINIVIL,ZESTRIL) 20 MG tablet Take 20 mg by mouth.    . meclizine  (ANTIVERT) 12.5 MG tablet Take 1 tablet (12.5 mg total) by mouth 3 (three) times daily as needed for dizziness. 45 tablet 1  . metoprolol succinate (TOPROL-XL) 50 MG 24 hr tablet Take 50 mg by mouth.    . nitroGLYCERIN (NITROSTAT) 0.4 MG SL tablet Place 0.4 mg under the tongue every 5 (five) minutes as needed for chest pain.    Marland Kitchen oxyCODONE (OXY IR/ROXICODONE) 5 MG immediate release tablet Take 5 mg by mouth.    . pregabalin (LYRICA) 50 MG capsule Take 1 capsule by mouth 3 (three) times daily.    Marland Kitchen tiotropium (SPIRIVA) 18 MCG inhalation capsule Place 18 mcg into inhaler and inhale daily.    Marland Kitchen albuterol (PROVENTIL HFA;VENTOLIN HFA) 108 (90 Base) MCG/ACT inhaler Inhale into the lungs.    . gabapentin (NEURONTIN) 600 MG tablet Take 600 mg by mouth 3 (three) times daily.    Vladimir Faster Glycol-Propyl Glycol 0.4-0.3 % SOLN Place 2 drops into both eyes as needed.    . simvastatin (ZOCOR) 20 MG tablet Take 20 mg by mouth.    . venlafaxine XR (EFFEXOR-XR) 150 MG 24 hr capsule Take 150 mg by mouth 2 (two) times daily.     No current facility-administered medications for this visit.     PHYSICAL EXAMINATION: ECOG PERFORMANCE STATUS: 0 - Asymptomatic  BP 106/73   Pulse 90   Temp (!) 97.3 F (36.3 C) (Tympanic)   Resp 20   Wt 146 lb 2 oz (66.3 kg)   SpO2 90%   BMI 26.73 kg/m   Filed Weights   01/14/19 1550  Weight: 146 lb 2 oz (66.3 kg)    Physical Exam  Constitutional: She is oriented to person, place, and time and well-developed, well-nourished, and in no distress.  Nasal cannula oxygen.  HENT:  Head: Normocephalic and atraumatic.  Mouth/Throat: Oropharynx is clear and moist. No oropharyngeal exudate.  Eyes: Pupils are equal, round, and reactive to light.  Neck: Normal range of motion. Neck supple.  Cardiovascular: Normal rate and regular rhythm.  Pulmonary/Chest: No respiratory distress. She has no wheezes.  Decreased air entry bilaterally.  Wheezing noted on the right side.   Abdominal: Soft. Bowel sounds are normal. She exhibits no distension and no mass. There is no abdominal tenderness. There is no rebound and no guarding.  Musculoskeletal: Normal range of motion.        General: No tenderness or edema.  Neurological: She is alert and oriented to person, place, and time.  Skin: Skin is warm.  Psychiatric: Affect normal.     LABORATORY DATA:  I have reviewed the data as listed    Component Value Date/Time   NA 133 (L) 01/17/2019 1000   NA 135 06/22/2014 0958   K 4.9 01/17/2019 1000   K 4.5 06/22/2014 0958   CL 91 (L) 01/17/2019 1000   CL 97 (L) 06/22/2014 2409  CO2 36 (H) 01/17/2019 1000   CO2 30 06/22/2014 0958   GLUCOSE 117 (H) 01/17/2019 1000   GLUCOSE 64 (L) 06/22/2014 0958   BUN 11 01/17/2019 1000   BUN 17 06/22/2014 0958   CREATININE 0.68 01/17/2019 1000   CREATININE 0.79 06/22/2014 0958   CALCIUM 9.4 01/17/2019 1000   CALCIUM 9.6 06/22/2014 0958   PROT 7.4 01/14/2018 0941   PROT 7.6 06/22/2014 0958   ALBUMIN 4.4 01/14/2018 0941   ALBUMIN 4.1 06/22/2014 0958   AST 24 01/14/2018 0941   AST 21 06/22/2014 0958   ALT 20 01/14/2018 0941   ALT 19 06/22/2014 0958   ALKPHOS 49 01/14/2018 0941   ALKPHOS 63 06/22/2014 0958   BILITOT 0.3 01/14/2018 0941   BILITOT 0.5 06/22/2014 0958   GFRNONAA >60 01/17/2019 1000   GFRNONAA >60 06/22/2014 0958   GFRAA >60 01/17/2019 1000   GFRAA >60 06/22/2014 0958    No results found for: SPEP, UPEP  Lab Results  Component Value Date   WBC 7.7 01/17/2019   NEUTROABS 5.8 01/17/2019   HGB 13.0 01/17/2019   HCT 41.7 01/17/2019   MCV 93.1 01/17/2019   PLT 183 01/17/2019      Chemistry      Component Value Date/Time   NA 133 (L) 01/17/2019 1000   NA 135 06/22/2014 0958   K 4.9 01/17/2019 1000   K 4.5 06/22/2014 0958   CL 91 (L) 01/17/2019 1000   CL 97 (L) 06/22/2014 0958   CO2 36 (H) 01/17/2019 1000   CO2 30 06/22/2014 0958   BUN 11 01/17/2019 1000   BUN 17 06/22/2014 0958   CREATININE  0.68 01/17/2019 1000   CREATININE 0.79 06/22/2014 0958      Component Value Date/Time   CALCIUM 9.4 01/17/2019 1000   CALCIUM 9.6 06/22/2014 0958   ALKPHOS 49 01/14/2018 0941   ALKPHOS 63 06/22/2014 0958   AST 24 01/14/2018 0941   AST 21 06/22/2014 0958   ALT 20 01/14/2018 0941   ALT 19 06/22/2014 0958   BILITOT 0.3 01/14/2018 0941   BILITOT 0.5 06/22/2014 0958       RADIOGRAPHIC STUDIES: I have personally reviewed the radiological images as listed and agreed with the findings in the report. No results found.   ASSESSMENT & PLAN:  Primary cancer of left upper lobe of lung ([place]) # Lung cancer s/p adjuvant chemo.  01/14/2018 pleural CT scan no evidence of recurrence.  Clinically stable.  Awaiting CT scan today.  # COPD on 3 Lit/[place].  Wheezing.  Recommend follow-up with pulmonary/PCP.  Reminded to use inhalers.  #Iron deficient anemia-today hemoglobin is 13 improved on p.o. iron.  Hb 10.2 [nov 2019]; [facility]- [facility] [[facility] CT NOV 2019- neg.   # DISPOSITION:  # follow up in 1 year-MD labs- cbc/cmp; /low dose CT scan* [same day sec to travel]/  Labs-Dr.B  CC:  Dr.Tolbert; [place].   Orders Placed This Encounter  Procedures  . CBC with Differential    Standing Status:   Future    Standing Expiration Date:   07/16/2020  . Comprehensive metabolic panel    Standing Status:   Future    Standing Expiration Date:   07/16/2020   All questions were answered. The patient knows to call the clinic with any problems, questions or concerns.      Cammie Sickle, MD 01/17/2019 1:03 PM

## 2019-01-19 ENCOUNTER — Encounter: Payer: Self-pay | Admitting: *Deleted

## 2019-01-20 ENCOUNTER — Other Ambulatory Visit: Payer: Self-pay | Admitting: Internal Medicine

## 2019-01-20 DIAGNOSIS — C3412 Malignant neoplasm of upper lobe, left bronchus or lung: Secondary | ICD-10-CM

## 2019-01-20 DIAGNOSIS — R300 Dysuria: Secondary | ICD-10-CM | POA: Insufficient documentation

## 2019-01-20 NOTE — Progress Notes (Signed)
H/T- Please inform pt that her lung scan-shows no evidence of recurrent cancer.  She has chronic emphysema/small lung nodules-unchanged compared to previous scan.  Recommend follow-up imaging in 1 year.  C- please schedule CT scan the same day of visit/pt's preference.

## 2019-01-20 NOTE — Progress Notes (Signed)
C- please schedule follow up CT chest in 1 year; please schedule the same day of MD visit[ pt prefernce]

## 2019-01-21 NOTE — Progress Notes (Signed)
Kaitlyn Mcbride, please sch ct scan in 1 year per md

## 2019-04-15 DIAGNOSIS — E139 Other specified diabetes mellitus without complications: Secondary | ICD-10-CM | POA: Insufficient documentation

## 2019-04-15 DIAGNOSIS — I50812 Chronic right heart failure: Secondary | ICD-10-CM | POA: Insufficient documentation

## 2019-08-04 DIAGNOSIS — K625 Hemorrhage of anus and rectum: Secondary | ICD-10-CM | POA: Insufficient documentation

## 2020-01-12 ENCOUNTER — Other Ambulatory Visit: Payer: Self-pay | Admitting: *Deleted

## 2020-01-12 DIAGNOSIS — Z87891 Personal history of nicotine dependence: Secondary | ICD-10-CM

## 2020-01-12 DIAGNOSIS — Z122 Encounter for screening for malignant neoplasm of respiratory organs: Secondary | ICD-10-CM

## 2020-01-17 ENCOUNTER — Ambulatory Visit
Admission: RE | Admit: 2020-01-17 | Discharge: 2020-01-17 | Disposition: A | Discharge status: Home / Self Care | Payer: Medicare Other | Source: Ambulatory Visit | Attending: Nurse Practitioner | Admitting: Nurse Practitioner

## 2020-01-17 ENCOUNTER — Other Ambulatory Visit: Payer: Self-pay

## 2020-01-17 ENCOUNTER — Ambulatory Visit: Payer: Medicare Other

## 2020-01-17 ENCOUNTER — Encounter: Payer: Self-pay | Admitting: Internal Medicine

## 2020-01-17 ENCOUNTER — Inpatient Hospital Stay (HOSPITAL_BASED_OUTPATIENT_CLINIC_OR_DEPARTMENT_OTHER): Payer: Medicare Other | Admitting: Internal Medicine

## 2020-01-17 ENCOUNTER — Inpatient Hospital Stay: Payer: Medicare Other | Attending: Internal Medicine

## 2020-01-17 VITALS — BP 103/75 | HR 103 | Temp 97.7°F | Resp 20

## 2020-01-17 DIAGNOSIS — C3412 Malignant neoplasm of upper lobe, left bronchus or lung: Secondary | ICD-10-CM

## 2020-01-17 DIAGNOSIS — Z87891 Personal history of nicotine dependence: Secondary | ICD-10-CM | POA: Insufficient documentation

## 2020-01-17 DIAGNOSIS — Z122 Encounter for screening for malignant neoplasm of respiratory organs: Secondary | ICD-10-CM | POA: Diagnosis present

## 2020-01-17 LAB — COMPREHENSIVE METABOLIC PANEL
ALT: 25 U/L (ref 0–44)
AST: 24 U/L (ref 15–41)
Albumin: 4.4 g/dL (ref 3.5–5.0)
Alkaline Phosphatase: 50 U/L (ref 38–126)
Anion gap: 12 (ref 5–15)
BUN: 9 mg/dL (ref 8–23)
CO2: 36 mmol/L — ABNORMAL HIGH (ref 22–32)
Calcium: 9.8 mg/dL (ref 8.9–10.3)
Chloride: 88 mmol/L — ABNORMAL LOW (ref 98–111)
Creatinine, Ser: 0.79 mg/dL (ref 0.44–1.00)
GFR, Estimated: >60 mL/min (ref >60)
Glucose, Bld: 107 mg/dL — ABNORMAL HIGH (ref 70–99)
Potassium: 3.7 mmol/L (ref 3.5–5.1)
Sodium: 136 mmol/L (ref 135–145)
Total Bilirubin: 0.5 mg/dL (ref 0.3–1.2)
Total Protein: 7.8 g/dL (ref 6.5–8.1)

## 2020-01-17 LAB — CBC WITH DIFFERENTIAL/PLATELET
Abs Immature Granulocytes: 0.01 10*3/uL (ref 0.00–0.07)
Basophils Absolute: 0 10*3/uL (ref 0.0–0.1)
Basophils Relative: 1 %
Eosinophils Absolute: 0.2 10*3/uL (ref 0.0–0.5)
Eosinophils Relative: 3 %
HCT: 38.5 % (ref 36.0–46.0)
Hemoglobin: 12.4 g/dL (ref 12.0–15.0)
Immature Granulocytes: 0 %
Lymphocytes Relative: 18 %
Lymphs Abs: 1.1 10*3/uL (ref 0.7–4.0)
MCH: 30.8 pg (ref 26.0–34.0)
MCHC: 32.2 g/dL (ref 30.0–36.0)
MCV: 95.8 fL (ref 80.0–100.0)
Monocytes Absolute: 0.6 10*3/uL (ref 0.1–1.0)
Monocytes Relative: 10 %
Neutro Abs: 4 10*3/uL (ref 1.7–7.7)
Neutrophils Relative %: 68 %
Platelets: 215 10*3/uL (ref 150–400)
RBC: 4.02 MIL/uL (ref 3.87–5.11)
RDW: 13.5 % (ref 11.5–15.5)
WBC: 5.8 10*3/uL (ref 4.0–10.5)
nRBC: 0 % (ref 0.0–0.2)

## 2020-01-17 NOTE — Progress Notes (Signed)
[facility] OFFICE PROGRESS NOTE  Patient Care Team: System, Provider Not In as PCP - General Cammie Sickle, MD as Consulting Physician (Hematology and Oncology)  Cancer Staging No matching staging information was found for the patient.   Oncology History Overview Note    # 2012- [facility] LUNG CANCER- Adenocarcinoma status post resection 5 cm tumor negative lymph nodes (September of 2012);  T2 b , N0, M0 tumor stage II a; [Dr.[facility]] 2. Adjuvant chemotherapy with carboplatinum and Alimta started in November of 2012; last CT scan 2017- NED.   # quit smoking- AUG 2017.  --------------------------------------------------  DIAGNOSIS: Lung cancer  STAGE:    II     ;GOALS: cure  CURRENT/MOST RECENT THERAPY: surveillaince    Primary cancer of left upper lobe of lung ([place])      INTERVAL HISTORY:  Kaitlyn Mcbride 69 y.o.  female pleasant patient above history of stage II adenocarcinoma of the lung status post surgery followed by adjuvant chemotherapy 2012 is here for follow-up.  Patient is awaiting CT scan this afternoon.  Patient continues to have chronic shortness of breath chronic cough.  Not any worse.  Intermittent wheezing.  No blood in stools or black or stools.  Patient has not had any recent hospitalizations.  Review of Systems  Constitutional: Negative for chills, diaphoresis, fever, malaise/fatigue and weight loss.  HENT: Negative for nosebleeds and sore throat.   Eyes: Negative for double vision.  Respiratory: Positive for cough, sputum production and shortness of breath. Negative for hemoptysis and wheezing.   Cardiovascular: Negative for chest pain, palpitations, orthopnea and leg swelling.  Gastrointestinal: Negative for abdominal pain, blood in stool, constipation, diarrhea, heartburn, melena, nausea and vomiting.  Genitourinary: Negative for dysuria, frequency and urgency.  Musculoskeletal: Negative for Mcbride pain and joint pain.  Skin: Negative.   Negative for itching and rash.  Neurological: Positive for dizziness. Negative for tingling, focal weakness, weakness and headaches.  Endo/Heme/Allergies: Does not bruise/bleed easily.  Psychiatric/Behavioral: Negative for depression. The patient is not nervous/anxious and does not have insomnia.      PAST MEDICAL HISTORY :  Past Medical History:  Diagnosis Date  . Cancer ([place])   . Diabetes mellitus without complication ([place])   . Dysphagia   . Hypercholesteremia   . Hypertension   . Lung cancer ([place])   . Migraine   . Myocardial infarction [facility])     PAST SURGICAL HISTORY :   Past Surgical History:  Procedure Laterality Date  . LUNG REMOVAL, PARTIAL Left    upper lobe  . THORACOTOMY Left     FAMILY HISTORY :  History reviewed. No pertinent family history.  SOCIAL HISTORY:   Social History   Tobacco Use  . Smoking status: Former Smoker    Packs/day: 0.75    Years: 60.00    Pack years: 45.00    Types: Cigarettes    Quit date: 10/01/2017    Years since quitting: 2.3  . Smokeless tobacco: Never Used  Substance Use Topics  . Alcohol use: Not Currently  . Drug use: No    ALLERGIES:  has No Known Allergies.  MEDICATIONS:  Current Outpatient Medications  Medication Sig Dispense Refill  . acetaminophen (TYLENOL) 325 MG tablet Take 650 mg by mouth every 6 (six) hours as needed.    Marland Kitchen albuterol (PROVENTIL HFA;VENTOLIN HFA) 108 (90 Base) MCG/ACT inhaler Inhale into the lungs.    Marland Kitchen aspirin EC 81 MG tablet Take 1 tablet by mouth daily.    Marland Kitchen  atorvastatin (LIPITOR) 10 MG tablet Take 10 mg by mouth.    . esomeprazole (NEXIUM) 40 MG capsule Take 40 mg by mouth daily at 12 noon.    . Fluticasone-Salmeterol (ADVAIR) 250-50 MCG/DOSE AEPB Inhale 1 puff into the lungs 2 (two) times daily.    Marland Kitchen gabapentin (NEURONTIN) 600 MG tablet Take 600 mg by mouth 3 (three) times daily.    . hydrochlorothiazide (HYDRODIURIL) 25 MG tablet Take 1 tablet by mouth daily.    Marland Kitchen lisinopril  (PRINIVIL,ZESTRIL) 20 MG tablet Take 20 mg by mouth.    . meclizine (ANTIVERT) 12.5 MG tablet Take 1 tablet (12.5 mg total) by mouth 3 (three) times daily as needed for dizziness. 45 tablet 1  . metoprolol succinate (TOPROL-XL) 50 MG 24 hr tablet Take 50 mg by mouth.    . oxyCODONE (OXY IR/ROXICODONE) 5 MG immediate release tablet Take 5 mg by mouth.    Vladimir Faster Glycol-Propyl Glycol 0.4-0.3 % SOLN Place 2 drops into both eyes as needed.    . pregabalin (LYRICA) 50 MG capsule Take 1 capsule by mouth 3 (three) times daily.    . sertraline (ZOLOFT) 100 MG tablet Take 100 mg by mouth daily.    . simvastatin (ZOCOR) 20 MG tablet Take 20 mg by mouth.    . tiotropium (SPIRIVA) 18 MCG inhalation capsule Place 18 mcg into inhaler and inhale daily.    . TRELEGY ELLIPTA 100-62.5-25 MCG/INH AEPB Inhale 1 puff into the lungs daily.    Marland Kitchen albuterol (PROVENTIL) (5 MG/ML) 0.5% nebulizer solution Take by nebulization 3 (three) times daily. (Patient not taking: Reported on 01/17/2020)    . nitroGLYCERIN (NITROSTAT) 0.4 MG SL tablet Place 0.4 mg under the tongue every 5 (five) minutes as needed for chest pain. (Patient not taking: Reported on 01/17/2020)     No current facility-administered medications for this visit.    PHYSICAL EXAMINATION: ECOG PERFORMANCE STATUS: 0 - Asymptomatic  BP 103/75   Pulse (!) 103   Temp 97.7 F (36.5 C) (Tympanic)   Resp 20   SpO2 99%   There were no vitals filed for this visit.  Physical Exam Constitutional:      Comments: Nasal cannula oxygen.  HENT:     Head: Normocephalic and atraumatic.     Mouth/Throat:     Pharynx: No oropharyngeal exudate.  Eyes:     Pupils: Pupils are equal, round, and reactive to light.  Cardiovascular:     Rate and Rhythm: Normal rate and regular rhythm.  Pulmonary:     Effort: No respiratory distress.     Breath sounds: No wheezing.  Abdominal:     General: Bowel sounds are normal. There is no distension.     Palpations: Abdomen  is soft. There is no mass.     Tenderness: There is no abdominal tenderness. There is no guarding or rebound.  Musculoskeletal:        General: No tenderness. Normal range of motion.     Cervical Mcbride: Normal range of motion and neck supple.  Skin:    General: Skin is warm.  Neurological:     Mental Status: She is alert and oriented to person, place, and time.  Psychiatric:        Mood and Affect: Affect normal.      LABORATORY DATA:  I have reviewed the data as listed    Component Value Date/Time   NA 136 01/17/2020 0952   NA 135 06/22/2014 0958   K 3.7 01/17/2020  0952   K 4.5 06/22/2014 0958   CL 88 (L) 01/17/2020 0952   CL 97 (L) 06/22/2014 0958   CO2 36 (H) 01/17/2020 0952   CO2 30 06/22/2014 0958   GLUCOSE 107 (H) 01/17/2020 0952   GLUCOSE 64 (L) 06/22/2014 0958   BUN 9 01/17/2020 0952   BUN 17 06/22/2014 0958   CREATININE 0.79 01/17/2020 0952   CREATININE 0.79 06/22/2014 0958   CALCIUM 9.8 01/17/2020 0952   CALCIUM 9.6 06/22/2014 0958   PROT 7.8 01/17/2020 0952   PROT 7.6 06/22/2014 0958   ALBUMIN 4.4 01/17/2020 0952   ALBUMIN 4.1 06/22/2014 0958   AST 24 01/17/2020 0952   AST 21 06/22/2014 0958   ALT 25 01/17/2020 0952   ALT 19 06/22/2014 0958   ALKPHOS 50 01/17/2020 0952   ALKPHOS 63 06/22/2014 0958   BILITOT 0.5 01/17/2020 0952   BILITOT 0.5 06/22/2014 0958   GFRNONAA >60 01/17/2020 0952   GFRNONAA >60 06/22/2014 0958   GFRAA >60 01/17/2019 1000   GFRAA >60 06/22/2014 0958    No results found for: SPEP, UPEP  Lab Results  Component Value Date   WBC 5.8 01/17/2020   NEUTROABS 4.0 01/17/2020   HGB 12.4 01/17/2020   HCT 38.5 01/17/2020   MCV 95.8 01/17/2020   PLT 215 01/17/2020      Chemistry      Component Value Date/Time   NA 136 01/17/2020 0952   NA 135 06/22/2014 0958   K 3.7 01/17/2020 0952   K 4.5 06/22/2014 0958   CL 88 (L) 01/17/2020 0952   CL 97 (L) 06/22/2014 0958   CO2 36 (H) 01/17/2020 0952   CO2 30 06/22/2014 0958   BUN 9  01/17/2020 0952   BUN 17 06/22/2014 0958   CREATININE 0.79 01/17/2020 0952   CREATININE 0.79 06/22/2014 0958      Component Value Date/Time   CALCIUM 9.8 01/17/2020 0952   CALCIUM 9.6 06/22/2014 0958   ALKPHOS 50 01/17/2020 0952   ALKPHOS 63 06/22/2014 0958   AST 24 01/17/2020 0952   AST 21 06/22/2014 0958   ALT 25 01/17/2020 0952   ALT 19 06/22/2014 0958   BILITOT 0.5 01/17/2020 0952   BILITOT 0.5 06/22/2014 0958       RADIOGRAPHIC STUDIES: I have personally reviewed the radiological images as listed and agreed with the findings in the report. CT CHEST LUNG CANCER SCREENING LOW DOSE WO CONTRAST  Result Date: 01/18/2020 CLINICAL DATA:  69 year old female former smoker (quit 4 years ago) with 45 pack-year history of smoking. Lung cancer screening examination. EXAM: CT CHEST WITHOUT CONTRAST LOW-DOSE FOR LUNG CANCER SCREENING TECHNIQUE: Multidetector CT imaging of the chest was performed following the standard protocol without IV contrast. COMPARISON:  Low-dose lung cancer screening chest CT 01/17/2019. FINDINGS: Cardiovascular: Heart size is normal. There is no significant pericardial fluid, thickening or pericardial calcification. There is aortic atherosclerosis, as well as atherosclerosis of the great vessels of the mediastinum and the coronary arteries, including calcified atherosclerotic plaque in the left main, left anterior descending, left circumflex and right coronary arteries. Mediastinum/Nodes: No pathologically enlarged mediastinal or hilar lymph nodes. Please note that accurate exclusion of hilar adenopathy is limited on noncontrast CT scans. Esophagus is unremarkable in appearance. No axillary lymphadenopathy. Lungs/Pleura: Status post left upper lobectomy. Multiple small pulmonary nodules are again noted throughout the lungs bilaterally, largest of which is in the posterior aspect of the superior segment of the left lower lobe near the apex (axial image  29 of series 3), with a  volume derived mean diameter of 4.4 mm. No other larger more suspicious appearing pulmonary nodules or masses are noted. No acute consolidative airspace disease. No pleural effusions. Diffuse bronchial wall thickening with moderate to severe centrilobular and paraseptal emphysema. Upper Abdomen: Aortic atherosclerosis. Musculoskeletal: There are no aggressive appearing lytic or blastic lesions noted in the visualized portions of the skeleton. IMPRESSION: 1. Lung-RADS 2S, benign appearance or behavior. Continue annual screening with low-dose chest CT without contrast in 12 months. 2. The "S" modifier above refers to potentially clinically significant non lung cancer related findings. Specifically, there is aortic atherosclerosis, in addition to left main and 3 vessel coronary artery disease. Please note that although the presence of coronary artery calcium documents the presence of coronary artery disease, the severity of this disease and any potential stenosis cannot be assessed on this non-gated CT examination. Assessment for potential risk factor modification, dietary therapy or pharmacologic therapy may be warranted, if clinically indicated. 3. Mild diffuse bronchial wall thickening with moderate to severe centrilobular and paraseptal emphysema; imaging findings suggestive of underlying COPD. Aortic Atherosclerosis (ICD10-I70.0) and Emphysema (ICD10-J43.9). Electronically Signed   By: Vinnie Langton M.D.   On: 01/18/2020 08:36     ASSESSMENT & PLAN:  Primary cancer of left upper lobe of lung ([place]) # Lung cancer s/p adjuvant chemo.  01/2019- pleural CT scan no evidence of recurrence.  Clinically-STABLE. 11/16- CT scan no evidence of any progression/recurrence; 4 mm lung nodule stable.  # COPD on 3 Lit/[place]- STABLE.  Recommend follow-up with pulmonary/PCP.  Reminded to use inhalers.  #Iron deficient anemia-today hemoglobin is 13 improved on p.o. iron.  Hb 10.2 [nov 2019]; [facility]- [facility] [[facility]]-  CT NOV 2019- neg.   # DISPOSITION: will call with result # follow up in 1 year-MD labs- cbc/cmp; /low dose CT scan* [same day sec to travel]/  Labs-Dr.B  CC:  Dr.Tolbert; [place].  # I reviewed the blood work- with the patient in detail; also reviewed the imaging independently [as summarized above]; and with the patient in detail.     Orders Placed This Encounter  Procedures  . CT CHEST NODULE FOLLOW UP LOW DOSE WO CM    Standing Status:   Future    Standing Expiration Date:   01/16/2021    Order Specific Question:   Preferred imaging location?    Answer:   [facility]   All questions were answered. The patient knows to call the clinic with any problems, questions or concerns.      Cammie Sickle, MD 01/19/2020 8:09 AM

## 2020-01-17 NOTE — Assessment & Plan Note (Addendum)
#   Lung cancer s/p adjuvant chemo.  01/2019- pleural CT scan no evidence of recurrence.  Clinically-STABLE. 11/16- CT scan no evidence of any progression/recurrence; 4 mm lung nodule stable.  # COPD on 3 Lit/[place]- STABLE.  Recommend follow-up with pulmonary/PCP.  Reminded to use inhalers.  #Iron deficient anemia-today hemoglobin is 13 improved on p.o. iron.  Hb 10.2 [nov 2019]; [facility]- [facility] [[facility] CT NOV 2019- neg.   # DISPOSITION: will call with result # follow up in 1 year-MD labs- cbc/cmp; /low dose CT scan* [same day sec to travel]/  Labs-Dr.B  CC:  Dr.Tolbert; [place].  # I reviewed the blood work- with the patient in detail; also reviewed the imaging independently [as summarized above]; and with the patient in detail.

## 2020-01-20 ENCOUNTER — Encounter: Payer: Self-pay | Admitting: *Deleted

## 2020-01-23 ENCOUNTER — Telehealth: Payer: Self-pay | Admitting: Internal Medicine

## 2020-01-23 NOTE — Telephone Encounter (Signed)
On 11/19-spoke to patient regarding results of the CT scan-stable small lung nodules.  Follow-up imaging as planned.

## 2020-07-18 DIAGNOSIS — M48061 Spinal stenosis, lumbar region without neurogenic claudication: Secondary | ICD-10-CM | POA: Insufficient documentation

## 2020-07-18 DIAGNOSIS — G894 Chronic pain syndrome: Secondary | ICD-10-CM | POA: Insufficient documentation

## 2020-07-18 DIAGNOSIS — G609 Hereditary and idiopathic neuropathy, unspecified: Secondary | ICD-10-CM | POA: Insufficient documentation

## 2020-07-18 DIAGNOSIS — G8929 Other chronic pain: Secondary | ICD-10-CM | POA: Insufficient documentation

## 2021-01-10 ENCOUNTER — Other Ambulatory Visit: Payer: Self-pay | Admitting: *Deleted

## 2021-01-10 DIAGNOSIS — C3412 Malignant neoplasm of upper lobe, left bronchus or lung: Secondary | ICD-10-CM

## 2021-01-16 ENCOUNTER — Other Ambulatory Visit: Payer: Self-pay

## 2021-01-16 ENCOUNTER — Inpatient Hospital Stay (HOSPITAL_BASED_OUTPATIENT_CLINIC_OR_DEPARTMENT_OTHER): Payer: Medicare Other | Admitting: Internal Medicine

## 2021-01-16 ENCOUNTER — Ambulatory Visit
Admission: RE | Admit: 2021-01-16 | Discharge: 2021-01-16 | Disposition: A | Discharge status: Home / Self Care | Payer: Medicare Other | Source: Ambulatory Visit | Attending: Internal Medicine | Admitting: Internal Medicine

## 2021-01-16 ENCOUNTER — Inpatient Hospital Stay: Payer: Medicare Other | Attending: Internal Medicine

## 2021-01-16 DIAGNOSIS — D509 Iron deficiency anemia, unspecified: Secondary | ICD-10-CM | POA: Insufficient documentation

## 2021-01-16 DIAGNOSIS — Z902 Acquired absence of lung [part of]: Secondary | ICD-10-CM | POA: Diagnosis not present

## 2021-01-16 DIAGNOSIS — Z85118 Personal history of other malignant neoplasm of bronchus and lung: Secondary | ICD-10-CM | POA: Insufficient documentation

## 2021-01-16 DIAGNOSIS — R059 Cough, unspecified: Secondary | ICD-10-CM | POA: Diagnosis not present

## 2021-01-16 DIAGNOSIS — R918 Other nonspecific abnormal finding of lung field: Secondary | ICD-10-CM | POA: Diagnosis not present

## 2021-01-16 DIAGNOSIS — R911 Solitary pulmonary nodule: Secondary | ICD-10-CM | POA: Insufficient documentation

## 2021-01-16 DIAGNOSIS — J449 Chronic obstructive pulmonary disease, unspecified: Secondary | ICD-10-CM | POA: Insufficient documentation

## 2021-01-16 DIAGNOSIS — Z122 Encounter for screening for malignant neoplasm of respiratory organs: Secondary | ICD-10-CM | POA: Diagnosis present

## 2021-01-16 DIAGNOSIS — Z87891 Personal history of nicotine dependence: Secondary | ICD-10-CM | POA: Insufficient documentation

## 2021-01-16 DIAGNOSIS — R0602 Shortness of breath: Secondary | ICD-10-CM | POA: Diagnosis not present

## 2021-01-16 DIAGNOSIS — Z9221 Personal history of antineoplastic chemotherapy: Secondary | ICD-10-CM | POA: Insufficient documentation

## 2021-01-16 DIAGNOSIS — C3412 Malignant neoplasm of upper lobe, left bronchus or lung: Secondary | ICD-10-CM

## 2021-01-16 DIAGNOSIS — Z9981 Dependence on supplemental oxygen: Secondary | ICD-10-CM | POA: Insufficient documentation

## 2021-01-16 LAB — COMPREHENSIVE METABOLIC PANEL
ALT: 17 U/L (ref 0–44)
AST: 23 U/L (ref 15–41)
Albumin: 4.3 g/dL (ref 3.5–5.0)
Alkaline Phosphatase: 58 U/L (ref 38–126)
Anion gap: 10 (ref 5–15)
BUN: 15 mg/dL (ref 8–23)
CO2: 34 mmol/L — ABNORMAL HIGH (ref 22–32)
Calcium: 9.1 mg/dL (ref 8.9–10.3)
Chloride: 88 mmol/L — ABNORMAL LOW (ref 98–111)
Creatinine, Ser: 0.81 mg/dL (ref 0.44–1.00)
GFR, Estimated: >60 mL/min (ref >60)
Glucose, Bld: 112 mg/dL — ABNORMAL HIGH (ref 70–99)
Potassium: 4.5 mmol/L (ref 3.5–5.1)
Sodium: 132 mmol/L — ABNORMAL LOW (ref 135–145)
Total Bilirubin: 0.3 mg/dL (ref 0.3–1.2)
Total Protein: 7.6 g/dL (ref 6.5–8.1)

## 2021-01-16 LAB — CBC WITH DIFFERENTIAL/PLATELET
Abs Immature Granulocytes: 0.02 10*3/uL (ref 0.00–0.07)
Basophils Absolute: 0 10*3/uL (ref 0.0–0.1)
Basophils Relative: 1 %
Eosinophils Absolute: 0.2 10*3/uL (ref 0.0–0.5)
Eosinophils Relative: 3 %
HCT: 32.9 % — ABNORMAL LOW (ref 36.0–46.0)
Hemoglobin: 10.1 g/dL — ABNORMAL LOW (ref 12.0–15.0)
Immature Granulocytes: 0 %
Lymphocytes Relative: 22 %
Lymphs Abs: 1.4 10*3/uL (ref 0.7–4.0)
MCH: 29.4 pg (ref 26.0–34.0)
MCHC: 30.7 g/dL (ref 30.0–36.0)
MCV: 95.6 fL (ref 80.0–100.0)
Monocytes Absolute: 0.7 10*3/uL (ref 0.1–1.0)
Monocytes Relative: 10 %
Neutro Abs: 4 10*3/uL (ref 1.7–7.7)
Neutrophils Relative %: 64 %
Platelets: 229 10*3/uL (ref 150–400)
RBC: 3.44 MIL/uL — ABNORMAL LOW (ref 3.87–5.11)
RDW: 13.2 % (ref 11.5–15.5)
WBC: 6.3 10*3/uL (ref 4.0–10.5)
nRBC: 0 % (ref 0.0–0.2)

## 2021-01-16 NOTE — Assessment & Plan Note (Addendum)
#   Lung cancer s/p adjuvant chemo.  01/16/21-low-dose CT scan- Solid pulmonary nodule of the left upper lobe has increased in size with a mean diameter of 3.7 mm. New solid pulmonary nodule the left upper lobe with a mean diameter of 4.0 mm. Lung-RADS 4A, suspicious. Follow up low-dose chest CT without contrast in 3 months.  # COPD on 3 Lit/[place]- STABLE [ PCP-reminded to use inhalers].   #Iron deficient anemia-Hb 10.2 [nov 2019]; [facility]- [facility] [[facility] CT NOV 2019- neg. Recommend p.o. iron once a day.  #Incidental findings on CT Imaging dated: NOV 16th, 2022: Three-vessel coronary calcification/arteriosclerosis/emphysema I reviewed/discussed/counseled the patient.   # DISPOSITION:  #Recommend follow-up-MD labs CT scan in 3 months.  CC:  Dr.Tolbert; [place].  # I reviewed the blood work- with the patient in detail; also reviewed the imaging independently [as summarized above]; and with the patient in detail.

## 2021-01-16 NOTE — Progress Notes (Signed)
[facility] OFFICE PROGRESS NOTE  Patient Care Team: System, Provider Not In as PCP - General Cammie Sickle, MD as Consulting Physician (Hematology and Oncology)   Cancer Staging  No matching staging information was found for the patient.   Oncology History Overview Note    # 2012- LUL LUNG CANCER- Adenocarcinoma status post resection 5 cm tumor negative lymph nodes (September of 2012);  T2 b , N0, M0 tumor stage II a; [Dr.Oaks] 2. Adjuvant chemotherapy with carboplatinum and Alimta started in November of 2012; last CT scan 2017- NED.   # quit smoking- AUG 2017.  --------------------------------------------------  DIAGNOSIS: Lung cancer  STAGE:    II     ;GOALS: cure  CURRENT/MOST RECENT THERAPY: surveillaince    Primary cancer of left upper lobe of lung ([place])     INTERVAL HISTORY: Kaitlyn Mcbride female patient.  Ambulating independently.  3 L of oxygen.  Kaitlyn Mcbride 70 y.o.  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  Patient had a CT scan this morning.  Patient continues her chronic shortness of breath.  Chronic cough.  Not any worse.  She continues to be on 3 L of oxygen.  No blood in stools or black or stools.  Patient admits to noncompliance with her iron tablets.  Review of Systems  Constitutional:  Positive for malaise/fatigue. Negative for chills, diaphoresis, fever and weight loss.  HENT:  Negative for nosebleeds and sore throat.   Eyes:  Negative for double vision.  Respiratory:  Positive for cough, sputum production and shortness of breath. Negative for hemoptysis and wheezing.   Cardiovascular:  Negative for chest pain, palpitations, orthopnea and leg swelling.  Gastrointestinal:  Negative for abdominal pain, blood in stool, constipation, diarrhea, heartburn, melena, nausea and  vomiting.  Genitourinary:  Negative for dysuria, frequency and urgency.  Musculoskeletal:  Negative for back pain and joint pain.  Skin: Negative.  Negative for itching and rash.  Neurological:  Positive for dizziness. Negative for tingling, focal weakness, weakness and headaches.  Endo/Heme/Allergies:  Does not bruise/bleed easily.  Psychiatric/Behavioral:  Negative for depression. The patient is not nervous/anxious and does not have insomnia.     PAST MEDICAL HISTORY :  Past Medical History:  Diagnosis Date   Cancer ([place])    Diabetes mellitus without complication ([place])    Dysphagia    Hypercholesteremia    Hypertension    Lung cancer ([place])    Migraine    Myocardial infarction ([place])     PAST SURGICAL HISTORY :   Past Surgical History:  Procedure Laterality Date   LUNG REMOVAL, PARTIAL Left    upper lobe   THORACOTOMY Left     FAMILY HISTORY :  No family history on file.  SOCIAL HISTORY:   Social History   Tobacco Use   Smoking status: Former    Packs/day: 0.75    Years: 60.00    Pack years: 45.00    Types: Cigarettes    Quit date: 10/01/2017    Years since quitting: 3.3   Smokeless tobacco: Never  Substance Use Topics   Alcohol use: Not Currently   Drug use: No    ALLERGIES:  has No Known Allergies.  MEDICATIONS:  Current Outpatient Medications  Medication Sig Dispense Refill   acetaminophen (TYLENOL) 325 MG tablet Take 650 mg by mouth every 6 (six) hours as  needed.     albuterol (PROVENTIL HFA;VENTOLIN HFA) 108 (90 Base) MCG/ACT inhaler Inhale into the lungs.     albuterol (PROVENTIL) (5 MG/ML) 0.5% nebulizer solution Take by nebulization 3 (three) times daily.     aspirin EC 81 MG tablet Take 1 tablet by mouth daily.     atorvastatin (LIPITOR) 10 MG tablet Take 10 mg by mouth.     esomeprazole (NEXIUM) 40 MG capsule Take 40 mg by mouth daily at 12 noon.     furosemide (LASIX) 20 MG tablet Take 20 mg by mouth. PRN     gabapentin (NEURONTIN) 600 MG tablet  Take 600 mg by mouth 3 (three) times daily.     hydrochlorothiazide (HYDRODIURIL) 25 MG tablet Take 1 tablet by mouth daily.     ketoconazole (NIZORAL) 2 % cream SMARTSIG:Sparingly Topical Daily     lisinopril (PRINIVIL,ZESTRIL) 20 MG tablet Take 20 mg by mouth.     metoprolol succinate (TOPROL-XL) 50 MG 24 hr tablet Take 50 mg by mouth.     OXYGEN Inhale 3 L into the lungs.     Polyethyl Glycol-Propyl Glycol 0.4-0.3 % SOLN Place 2 drops into both eyes as needed.     rOPINIRole (REQUIP) 2 MG tablet Take 2 mg by mouth at bedtime.     sertraline (ZOLOFT) 100 MG tablet Take 100 mg by mouth daily.     TRELEGY ELLIPTA 100-62.5-25 MCG/INH AEPB Inhale 1 puff into the lungs daily.     Fluticasone-Salmeterol (ADVAIR) 250-50 MCG/DOSE AEPB Inhale 1 puff into the lungs 2 (two) times daily. (Patient not taking: Reported on 01/16/2021)     meclizine (ANTIVERT) 12.5 MG tablet Take 1 tablet (12.5 mg total) by mouth 3 (three) times daily as needed for dizziness. (Patient not taking: Reported on 01/16/2021) 45 tablet 1   meloxicam (MOBIC) 15 MG tablet Take by mouth. (Patient not taking: Reported on 01/16/2021)     nitroGLYCERIN (NITROSTAT) 0.4 MG SL tablet Place 0.4 mg under the tongue every 5 (five) minutes as needed for chest pain. (Patient not taking: No sig reported)     oxyCODONE (OXY IR/ROXICODONE) 5 MG immediate release tablet Take 5 mg by mouth. (Patient not taking: Reported on 01/16/2021)     pregabalin (LYRICA) 50 MG capsule Take 1 capsule by mouth 3 (three) times daily. (Patient not taking: Reported on 01/16/2021)     simvastatin (ZOCOR) 20 MG tablet Take 20 mg by mouth. (Patient not taking: Reported on 01/16/2021)     tiotropium (SPIRIVA) 18 MCG inhalation capsule Place 18 mcg into inhaler and inhale daily. (Patient not taking: Reported on 01/16/2021)     No current facility-administered medications for this visit.    PHYSICAL EXAMINATION: ECOG PERFORMANCE STATUS: 0 - Asymptomatic  BP (!) 108/54  (BP Location: Left Arm, Patient Position: Sitting)   Pulse 73   Temp 97.7 F (36.5 C) (Tympanic)   Resp 19   Wt 148 lb (67.1 kg)   SpO2 98%   BMI 27.07 kg/m   Filed Weights   01/16/21 1313  Weight: 148 lb (67.1 kg)    Physical Exam Constitutional:      Comments: Nasal cannula oxygen.  HENT:     Head: Normocephalic and atraumatic.     Mouth/Throat:     Pharynx: No oropharyngeal exudate.  Eyes:     Pupils: Pupils are equal, round, and reactive to light.  Cardiovascular:     Rate and Rhythm: Normal rate and regular rhythm.  Pulmonary:  Effort: No respiratory distress.     Breath sounds: No wheezing.  Abdominal:     General: Bowel sounds are normal. There is no distension.     Palpations: Abdomen is soft. There is no mass.     Tenderness: There is no abdominal tenderness. There is no guarding or rebound.  Musculoskeletal:        General: No tenderness. Normal range of motion.     Cervical back: Normal range of motion and neck supple.  Skin:    General: Skin is warm.  Neurological:     Mental Status: She is alert and oriented to person, place, and time.  Psychiatric:        Mood and Affect: Affect normal.     LABORATORY DATA:  I have reviewed the data as listed    Component Value Date/Time   NA 132 (L) 01/16/2021 0957   NA 135 06/22/2014 0958   K 4.5 01/16/2021 0957   K 4.5 06/22/2014 0958   CL 88 (L) 01/16/2021 0957   CL 97 (L) 06/22/2014 0958   CO2 34 (H) 01/16/2021 0957   CO2 30 06/22/2014 0958   GLUCOSE 112 (H) 01/16/2021 0957   GLUCOSE 64 (L) 06/22/2014 0958   BUN 15 01/16/2021 0957   BUN 17 06/22/2014 0958   CREATININE 0.81 01/16/2021 0957   CREATININE 0.79 06/22/2014 0958   CALCIUM 9.1 01/16/2021 0957   CALCIUM 9.6 06/22/2014 0958   PROT 7.6 01/16/2021 0957   PROT 7.6 06/22/2014 0958   ALBUMIN 4.3 01/16/2021 0957   ALBUMIN 4.1 06/22/2014 0958   AST 23 01/16/2021 0957   AST 21 06/22/2014 0958   ALT 17 01/16/2021 0957   ALT 19 06/22/2014 0958    ALKPHOS 58 01/16/2021 0957   ALKPHOS 63 06/22/2014 0958   BILITOT 0.3 01/16/2021 0957   BILITOT 0.5 06/22/2014 0958   GFRNONAA >60 01/16/2021 0957   GFRNONAA >60 06/22/2014 0958   GFRAA >60 01/17/2019 1000   GFRAA >60 06/22/2014 0958    No results found for: SPEP, UPEP  Lab Results  Component Value Date   WBC 6.3 01/16/2021   NEUTROABS 4.0 01/16/2021   HGB 10.1 (L) 01/16/2021   HCT 32.9 (L) 01/16/2021   MCV 95.6 01/16/2021   PLT 229 01/16/2021      Chemistry      Component Value Date/Time   NA 132 (L) 01/16/2021 0957   NA 135 06/22/2014 0958   K 4.5 01/16/2021 0957   K 4.5 06/22/2014 0958   CL 88 (L) 01/16/2021 0957   CL 97 (L) 06/22/2014 0958   CO2 34 (H) 01/16/2021 0957   CO2 30 06/22/2014 0958   BUN 15 01/16/2021 0957   BUN 17 06/22/2014 0958   CREATININE 0.81 01/16/2021 0957   CREATININE 0.79 06/22/2014 0958      Component Value Date/Time   CALCIUM 9.1 01/16/2021 0957   CALCIUM 9.6 06/22/2014 0958   ALKPHOS 58 01/16/2021 0957   ALKPHOS 63 06/22/2014 0958   AST 23 01/16/2021 0957   AST 21 06/22/2014 0958   ALT 17 01/16/2021 0957   ALT 19 06/22/2014 0958   BILITOT 0.3 01/16/2021 0957   BILITOT 0.5 06/22/2014 0958       RADIOGRAPHIC STUDIES: I have personally reviewed the radiological images as listed and agreed with the findings in the report. CT CHEST LUNG CA SCREEN LOW DOSE W/O CM  Result Date: 01/17/2021 CLINICAL DATA:  Former smoker with 45 pack-year history. History of non-small  cell lung cancer. EXAM: CT CHEST WITHOUT CONTRAST LOW-DOSE FOR LUNG CANCER SCREENING TECHNIQUE: Multidetector CT imaging of the chest was performed following the standard protocol without IV contrast. COMPARISON:  CT lung cancer screening dated January 17, 2019 FINDINGS: Cardiovascular: Normal heart size. No pericardial effusion. Three-vessel coronary artery calcifications. Atherosclerotic disease of the thoracic aorta. Mediastinum/Nodes: Patulous esophagus. Thyroid is  unremarkable. No enlarged lymph nodes seen in the chest. Lungs/Pleura: Central airways are patent. Bilateral bronchiectasis. Status post left upper lobectomy. Upper lobe predominant centrilobular emphysema.Solid pulmonary nodule of the left upper lobe located on image 187 has increased in size with a mean diameter of 3.7 mm. New solid pulmonary nodule the left upper lobe located on image 148 with a mean diameter of 4.0 mm. New small clustered nodules of the right lower lobe measuring less than 4 mm, likely due to infection or aspiration. Upper Abdomen: No acute abnormality. Musculoskeletal: No chest wall mass or suspicious bone lesions identified. IMPRESSION: 1. Solid pulmonary nodule of the left upper lobe has increased in size with a mean diameter of 3.7 mm. New solid pulmonary nodule the left upper lobe with a mean diameter of 4.0 mm. Lung-RADS 4A, suspicious. Follow up low-dose chest CT without contrast in 3 months (please use the following order, "CT CHEST LCS NODULE FOLLOW-UP W/O CM") is recommended. Alternatively, PET may be considered when there is a solid component 58mm or larger. 2. Three-vessel coronary artery calcifications. 3. Aortic Atherosclerosis (ICD10-I70.0) and Emphysema (ICD10-J43.9). Electronically Signed   By: Yetta Glassman M.D.   On: 01/17/2021 16:02      ASSESSMENT & PLAN:  Primary cancer of left upper lobe of lung ([place]) # Lung cancer s/p adjuvant chemo.  01/16/21-low-dose CT scan- Solid pulmonary nodule of the left upper lobe has increased in size with a mean diameter of 3.7 mm. New solid pulmonary nodule the left upper lobe with a mean diameter of 4.0 mm. Lung-RADS 4A, suspicious. Follow up low-dose chest CT without contrast in 3 months.  # COPD on 3 Lit/[place]- STABLE [ PCP-reminded to use inhalers].   #Iron deficient anemia-Hb 10.2 [nov 2019]; [facility]- [facility] [[facility] CT NOV [facility]- neg. Recommend p.o. iron once a day.  #Incidental findings on CT Imaging dated: NOV 16th,  2022: Three-vessel coronary calcification/arteriosclerosis/emphysema I reviewed/discussed/counseled the patient.   # DISPOSITION:  #Recommend follow-up-MD labs CT scan in 3 months.  CC:  Dr.Tolbert; [place].  # I reviewed the blood work- with the patient in detail; also reviewed the imaging independently [as summarized above]; and with the patient in detail.     Orders Placed This Encounter  Procedures   CT CHEST NODULE FOLLOW UP LOW DOSE WO CM    Standing Status:   Future    Standing Expiration Date:   01/16/2022    Order Specific Question:   Preferred imaging location?    Answer:   [facility]   CBC with Differential    Standing Status:   Future    Standing Expiration Date:   01/16/2022   Comprehensive metabolic panel    Standing Status:   Future    Standing Expiration Date:   01/16/2022   Iron and TIBC    Standing Status:   Future    Standing Expiration Date:   01/16/2022   Ferritin    Standing Status:   Future    Standing Expiration Date:   01/16/2022   All questions were answered. The patient knows to call the clinic with any problems, questions or concerns.  Cammie Sickle, MD 01/18/2021 6:28 AM

## 2021-01-16 NOTE — Progress Notes (Signed)
Patient reports nausea from restless leg medicine, constipation, and not sleeping well. She is on 4L of oxygen via nasal cannula.

## 2021-01-18 ENCOUNTER — Telehealth: Payer: Self-pay | Admitting: Internal Medicine

## 2021-01-18 NOTE — Telephone Encounter (Signed)
Spoke to patient regarding the results of the lung cancer screening CT scan-recommend follow-up in 3 months rather than 12 months.  C- Please schedule of the appointment to 3 months from now  Hayley-FYI

## 2021-04-08 ENCOUNTER — Other Ambulatory Visit: Payer: Self-pay | Admitting: *Deleted

## 2021-04-08 DIAGNOSIS — C3412 Malignant neoplasm of upper lobe, left bronchus or lung: Secondary | ICD-10-CM

## 2021-04-19 ENCOUNTER — Other Ambulatory Visit: Payer: Self-pay

## 2021-04-19 ENCOUNTER — Ambulatory Visit
Admission: RE | Admit: 2021-04-19 | Discharge: 2021-04-19 | Disposition: A | Discharge status: Home / Self Care | Payer: Medicare Other | Source: Ambulatory Visit | Attending: Internal Medicine | Admitting: Internal Medicine

## 2021-04-19 ENCOUNTER — Inpatient Hospital Stay: Payer: Medicare Other | Attending: Internal Medicine | Admitting: Internal Medicine

## 2021-04-19 ENCOUNTER — Encounter: Payer: Self-pay | Admitting: Internal Medicine

## 2021-04-19 ENCOUNTER — Inpatient Hospital Stay: Payer: Medicare Other

## 2021-04-19 DIAGNOSIS — R911 Solitary pulmonary nodule: Secondary | ICD-10-CM | POA: Diagnosis present

## 2021-04-19 DIAGNOSIS — Z79899 Other long term (current) drug therapy: Secondary | ICD-10-CM | POA: Diagnosis not present

## 2021-04-19 DIAGNOSIS — Z85118 Personal history of other malignant neoplasm of bronchus and lung: Secondary | ICD-10-CM | POA: Diagnosis not present

## 2021-04-19 DIAGNOSIS — R002 Palpitations: Secondary | ICD-10-CM | POA: Diagnosis not present

## 2021-04-19 DIAGNOSIS — I2510 Atherosclerotic heart disease of native coronary artery without angina pectoris: Secondary | ICD-10-CM | POA: Diagnosis not present

## 2021-04-19 DIAGNOSIS — Z87891 Personal history of nicotine dependence: Secondary | ICD-10-CM | POA: Insufficient documentation

## 2021-04-19 DIAGNOSIS — Z122 Encounter for screening for malignant neoplasm of respiratory organs: Secondary | ICD-10-CM | POA: Diagnosis not present

## 2021-04-19 DIAGNOSIS — J449 Chronic obstructive pulmonary disease, unspecified: Secondary | ICD-10-CM | POA: Diagnosis not present

## 2021-04-19 DIAGNOSIS — I700 Atherosclerosis of aorta: Secondary | ICD-10-CM | POA: Insufficient documentation

## 2021-04-19 DIAGNOSIS — C3412 Malignant neoplasm of upper lobe, left bronchus or lung: Secondary | ICD-10-CM

## 2021-04-19 DIAGNOSIS — J439 Emphysema, unspecified: Secondary | ICD-10-CM | POA: Insufficient documentation

## 2021-04-19 DIAGNOSIS — Z9981 Dependence on supplemental oxygen: Secondary | ICD-10-CM | POA: Diagnosis not present

## 2021-04-19 DIAGNOSIS — D509 Iron deficiency anemia, unspecified: Secondary | ICD-10-CM | POA: Insufficient documentation

## 2021-04-19 LAB — COMPREHENSIVE METABOLIC PANEL
ALT: 17 U/L (ref 0–44)
AST: 21 U/L (ref 15–41)
Albumin: 4.1 g/dL (ref 3.5–5.0)
Alkaline Phosphatase: 57 U/L (ref 38–126)
Anion gap: 5 (ref 5–15)
BUN: 18 mg/dL (ref 8–23)
CO2: 36 mmol/L — ABNORMAL HIGH (ref 22–32)
Calcium: 9.3 mg/dL (ref 8.9–10.3)
Chloride: 89 mmol/L — ABNORMAL LOW (ref 98–111)
Creatinine, Ser: 1 mg/dL (ref 0.44–1.00)
GFR, Estimated: >60 mL/min (ref >60)
Glucose, Bld: 121 mg/dL — ABNORMAL HIGH (ref 70–99)
Potassium: 4.6 mmol/L (ref 3.5–5.1)
Sodium: 130 mmol/L — ABNORMAL LOW (ref 135–145)
Total Bilirubin: 0.2 mg/dL — ABNORMAL LOW (ref 0.3–1.2)
Total Protein: 7.2 g/dL (ref 6.5–8.1)

## 2021-04-19 LAB — CBC WITH DIFFERENTIAL/PLATELET
Abs Immature Granulocytes: 0.02 10*3/uL (ref 0.00–0.07)
Basophils Absolute: 0 10*3/uL (ref 0.0–0.1)
Basophils Relative: 0 %
Eosinophils Absolute: 0.2 10*3/uL (ref 0.0–0.5)
Eosinophils Relative: 2 %
HCT: 38.1 % (ref 36.0–46.0)
Hemoglobin: 12.1 g/dL (ref 12.0–15.0)
Immature Granulocytes: 0 %
Lymphocytes Relative: 18 %
Lymphs Abs: 1.4 10*3/uL (ref 0.7–4.0)
MCH: 29.8 pg (ref 26.0–34.0)
MCHC: 31.8 g/dL (ref 30.0–36.0)
MCV: 93.8 fL (ref 80.0–100.0)
Monocytes Absolute: 0.6 10*3/uL (ref 0.1–1.0)
Monocytes Relative: 8 %
Neutro Abs: 5.6 10*3/uL (ref 1.7–7.7)
Neutrophils Relative %: 72 %
Platelets: 229 10*3/uL (ref 150–400)
RBC: 4.06 MIL/uL (ref 3.87–5.11)
RDW: 13.6 % (ref 11.5–15.5)
WBC: 7.8 10*3/uL (ref 4.0–10.5)
nRBC: 0 % (ref 0.0–0.2)

## 2021-04-19 LAB — IRON AND TIBC
Iron: 51 ug/dL (ref 28–170)
Saturation Ratios: 13 % (ref 10.4–31.8)
TIBC: 381 ug/dL (ref 250–450)
UIBC: 330 ug/dL

## 2021-04-19 LAB — FERRITIN: Ferritin: 29 ng/mL (ref 11–307)

## 2021-04-19 NOTE — Progress Notes (Signed)
[facility] OFFICE PROGRESS NOTE  Patient Care Team: System, Provider Not In as PCP - General Cammie Sickle, MD as Consulting Physician (Hematology and Oncology)   Cancer Staging  No matching staging information was found for the patient.   Oncology History Overview Note    # 2012- LUL LUNG CANCER- Adenocarcinoma status post resection 5 cm tumor negative lymph nodes (September of 2012);  T2 b , N0, M0 tumor stage II a; [Dr.Oaks] 2. Adjuvant chemotherapy with carboplatinum and Alimta started in November of 2012; last CT scan 2017- NED.   # quit smoking- AUG 2017.  --------------------------------------------------  DIAGNOSIS: Lung cancer  STAGE:    II     ;GOALS: cure  CURRENT/MOST RECENT THERAPY: surveillaince    Primary cancer of left upper lobe of lung ([place])     INTERVAL HISTORY: Kaitlyn Mcbride female patient.  Ambulating independently.  3 L of oxygen.  Kaitlyn Mcbride 71 y.o.  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  Patient had a CT scan this morning.  States that recently she has had noted to have irregular heart rate by home nurse.  She is currently awaiting cardiology evaluation.  Patient continues her chronic shortness of breath.  Chronic cough.  Not any worse.  She continues to be on 3 L of oxygen.  No blood in stools or black or stools.  Patient admits to noncompliance with her iron tablets.  Review of Systems  Constitutional:  Positive for malaise/fatigue. Negative for chills, diaphoresis, fever and weight loss.  HENT:  Negative for nosebleeds and sore throat.   Eyes:  Negative for double vision.  Respiratory:  Positive for cough, sputum production and shortness of breath. Negative for hemoptysis and wheezing.   Cardiovascular:  Negative for chest pain, palpitations, orthopnea and leg  swelling.  Gastrointestinal:  Negative for abdominal pain, blood in stool, constipation, diarrhea, heartburn, melena, nausea and vomiting.  Genitourinary:  Negative for dysuria, frequency and urgency.  Musculoskeletal:  Negative for back pain and joint pain.  Skin: Negative.  Negative for itching and rash.  Neurological:  Positive for dizziness. Negative for tingling, focal weakness, weakness and headaches.  Endo/Heme/Allergies:  Does not bruise/bleed easily.  Psychiatric/Behavioral:  Negative for depression. The patient is not nervous/anxious and does not have insomnia.     PAST MEDICAL HISTORY :  Past Medical History:  Diagnosis Date   Cancer ([place])    Diabetes mellitus without complication ([place])    Dysphagia    Hypercholesteremia    Hypertension    Lung cancer ([place])    Migraine    Myocardial infarction ([place])     PAST SURGICAL HISTORY :   Past Surgical History:  Procedure Laterality Date   LUNG REMOVAL, PARTIAL Left    upper lobe   THORACOTOMY Left     FAMILY HISTORY :  History reviewed. No pertinent family history.  SOCIAL HISTORY:   Social History   Tobacco Use   Smoking status: Former    Packs/day: 0.75    Years: 60.00    Pack years: 45.00    Types: Cigarettes    Quit date: 10/01/2017    Years since quitting: 3.5   Smokeless tobacco: Never  Substance Use Topics   Alcohol use: Not Currently   Drug use: No    ALLERGIES:  has No Known Allergies.  MEDICATIONS:  Current  Outpatient Medications  Medication Sig Dispense Refill   acetaminophen (TYLENOL) 325 MG tablet Take 650 mg by mouth every 6 (six) hours as needed.     albuterol (PROVENTIL HFA;VENTOLIN HFA) 108 (90 Base) MCG/ACT inhaler Inhale into the lungs.     albuterol (PROVENTIL) (5 MG/ML) 0.5% nebulizer solution Take by nebulization 3 (three) times daily.     aspirin EC 81 MG tablet Take 1 tablet by mouth daily.     atorvastatin (LIPITOR) 10 MG tablet Take 10 mg by mouth.     esomeprazole (NEXIUM) 40 MG  capsule Take 40 mg by mouth daily at 12 noon.     gabapentin (NEURONTIN) 600 MG tablet Take 600 mg by mouth 3 (three) times daily.     hydrochlorothiazide (HYDRODIURIL) 25 MG tablet Take 1 tablet by mouth daily.     ketoconazole (NIZORAL) 2 % cream SMARTSIG:Sparingly Topical Daily     lisinopril (PRINIVIL,ZESTRIL) 20 MG tablet Take 20 mg by mouth.     metoprolol succinate (TOPROL-XL) 50 MG 24 hr tablet Take 50 mg by mouth.     OXYGEN Inhale 3 L into the lungs.     Polyethyl Glycol-Propyl Glycol 0.4-0.3 % SOLN Place 2 drops into both eyes as needed.     rOPINIRole (REQUIP) 2 MG tablet Take 2 mg by mouth at bedtime.     sertraline (ZOLOFT) 100 MG tablet Take 100 mg by mouth daily.     TRELEGY ELLIPTA 100-62.5-25 MCG/INH AEPB Inhale 1 puff into the lungs daily.     Fluticasone-Salmeterol (ADVAIR) 250-50 MCG/DOSE AEPB Inhale 1 puff into the lungs 2 (two) times daily. (Patient not taking: Reported on 01/16/2021)     furosemide (LASIX) 20 MG tablet Take 20 mg by mouth. PRN (Patient not taking: Reported on 04/19/2021)     meclizine (ANTIVERT) 12.5 MG tablet Take 1 tablet (12.5 mg total) by mouth 3 (three) times daily as needed for dizziness. (Patient not taking: Reported on 01/16/2021) 45 tablet 1   meloxicam (MOBIC) 15 MG tablet Take by mouth. (Patient not taking: Reported on 01/16/2021)     nitroGLYCERIN (NITROSTAT) 0.4 MG SL tablet Place 0.4 mg under the tongue every 5 (five) minutes as needed for chest pain. (Patient not taking: Reported on 01/17/2020)     oxyCODONE (OXY IR/ROXICODONE) 5 MG immediate release tablet Take 5 mg by mouth. (Patient not taking: Reported on 01/16/2021)     pregabalin (LYRICA) 50 MG capsule Take 1 capsule by mouth 3 (three) times daily. (Patient not taking: Reported on 01/16/2021)     simvastatin (ZOCOR) 20 MG tablet Take 20 mg by mouth. (Patient not taking: Reported on 01/16/2021)     tiotropium (SPIRIVA) 18 MCG inhalation capsule Place 18 mcg into inhaler and inhale daily.  (Patient not taking: Reported on 01/16/2021)     No current facility-administered medications for this visit.    PHYSICAL EXAMINATION: ECOG PERFORMANCE STATUS: 0 - Asymptomatic  BP (!) 97/47    Pulse 83    Temp (!) 97 F (36.1 C)    Resp 18    Wt 150 lb 12.8 oz (68.4 kg)    SpO2 97%    BMI 27.58 kg/m   Filed Weights   04/19/21 1500  Weight: 150 lb 12.8 oz (68.4 kg)    Physical Exam Constitutional:      Comments: Nasal cannula oxygen.  HENT:     Head: Normocephalic and atraumatic.     Mouth/Throat:     Pharynx: No oropharyngeal exudate.  Eyes:     Pupils: Pupils are equal, round, and reactive to light.  Cardiovascular:     Rate and Rhythm: Normal rate and regular rhythm.  Pulmonary:     Effort: No respiratory distress.     Breath sounds: No wheezing.  Abdominal:     General: Bowel sounds are normal. There is no distension.     Palpations: Abdomen is soft. There is no mass.     Tenderness: There is no abdominal tenderness. There is no guarding or rebound.  Musculoskeletal:        General: No tenderness. Normal range of motion.     Cervical back: Normal range of motion and neck supple.  Skin:    General: Skin is warm.  Neurological:     Mental Status: She is alert and oriented to person, place, and time.  Psychiatric:        Mood and Affect: Affect normal.     LABORATORY DATA:  I have reviewed the data as listed    Component Value Date/Time   NA 130 (L) 04/19/2021 1156   NA 135 06/22/2014 0958   K 4.6 04/19/2021 1156   K 4.5 06/22/2014 0958   CL 89 (L) 04/19/2021 1156   CL 97 (L) 06/22/2014 0958   CO2 36 (H) 04/19/2021 1156   CO2 30 06/22/2014 0958   GLUCOSE 121 (H) 04/19/2021 1156   GLUCOSE 64 (L) 06/22/2014 0958   BUN 18 04/19/2021 1156   BUN 17 06/22/2014 0958   CREATININE 1.00 04/19/2021 1156   CREATININE 0.79 06/22/2014 0958   CALCIUM 9.3 04/19/2021 1156   CALCIUM 9.6 06/22/2014 0958   PROT 7.2 04/19/2021 1156   PROT 7.6 06/22/2014 0958   ALBUMIN  4.1 04/19/2021 1156   ALBUMIN 4.1 06/22/2014 0958   AST 21 04/19/2021 1156   AST 21 06/22/2014 0958   ALT 17 04/19/2021 1156   ALT 19 06/22/2014 0958   ALKPHOS 57 04/19/2021 1156   ALKPHOS 63 06/22/2014 0958   BILITOT 0.2 (L) 04/19/2021 1156   BILITOT 0.5 06/22/2014 0958   GFRNONAA >60 04/19/2021 1156   GFRNONAA >60 06/22/2014 0958   GFRAA >60 01/17/2019 1000   GFRAA >60 06/22/2014 0958    No results found for: SPEP, UPEP  Lab Results  Component Value Date   WBC 7.8 04/19/2021   NEUTROABS 5.6 04/19/2021   HGB 12.1 04/19/2021   HCT 38.1 04/19/2021   MCV 93.8 04/19/2021   PLT 229 04/19/2021      Chemistry      Component Value Date/Time   NA 130 (L) 04/19/2021 1156   NA 135 06/22/2014 0958   K 4.6 04/19/2021 1156   K 4.5 06/22/2014 0958   CL 89 (L) 04/19/2021 1156   CL 97 (L) 06/22/2014 0958   CO2 36 (H) 04/19/2021 1156   CO2 30 06/22/2014 0958   BUN 18 04/19/2021 1156   BUN 17 06/22/2014 0958   CREATININE 1.00 04/19/2021 1156   CREATININE 0.79 06/22/2014 0958      Component Value Date/Time   CALCIUM 9.3 04/19/2021 1156   CALCIUM 9.6 06/22/2014 0958   ALKPHOS 57 04/19/2021 1156   ALKPHOS 63 06/22/2014 0958   AST 21 04/19/2021 1156   AST 21 06/22/2014 0958   ALT 17 04/19/2021 1156   ALT 19 06/22/2014 0958   BILITOT 0.2 (L) 04/19/2021 1156   BILITOT 0.5 06/22/2014 0958       RADIOGRAPHIC STUDIES: I have personally reviewed the radiological images as  listed and agreed with the findings in the report. No results found.    ASSESSMENT & PLAN:  Primary cancer of left upper lobe of lung ([place]) # Lung cancer s/p adjuvant chemo.  01/16/21-low-dose CT scan- Solid pulmonary nodule of the left upper lobe has increased in size with a mean diameter of 3.7 mm. New solid pulmonary nodule the left upper lobe with a mean diameter of 4.0 mm. Lung-RADS 4A, suspicious.  Today CT FEB 17th, 2023-no evidence of any progressive disease noted.  Recommend follow-up imaging in 12  months.  # COPD on 3 Lit/[place]- STABLE [ PCP-reminded to use inhalers]; awaiting to start inhaler from PCP.    # Palpitation- awaiting cards evaluation.   #Iron deficient anemia-Hb 12.2 [nov 2019]; [facility]- [facility] [[facility] CT NOV 2019- neg. Recommend p.o. iron once a day.  #Incidental findings on CT Imaging dated: February 2023 three-vessel coronary calcification/arteriosclerosis/emphysema I reviewed/discussed/counseled the patient.   # DISPOSITION:  # follow up TBD- Dr.B  CC:  Dr.Tolbert; [place].  # I reviewed the blood work- with the patient in detail; also reviewed the imaging independently [as summarized above]; and with the patient in detail.  Addendum: I reviewed the results of the CT scan with the patient in detail.  Recommend follow-up in 12 months; CT/MD visit labs same day.      Orders Placed This Encounter  Procedures   CT CHEST NODULE FOLLOW UP LOW DOSE WO CM    Standing Status:   Future    Standing Expiration Date:   04/23/2022    Order Specific Question:   Preferred imaging location?    Answer:   [facility]   All questions were answered. The patient knows to call the clinic with any problems, questions or concerns.      Cammie Sickle, MD 04/23/2021 10:11 PM

## 2021-04-19 NOTE — Progress Notes (Signed)
Patient denies new problems/concerns today.    Patient's blood pressure in office 97/47, HR 83.  Home health nurse advised her that she had an irregular heart rate.  PCP has referred her to cardiologist with an appt on 05/20/21.  Does have blood pressure monitor at home but does not use.

## 2021-04-19 NOTE — Assessment & Plan Note (Addendum)
#   Lung cancer s/p adjuvant chemo.  01/16/21-low-dose CT scan- Solid pulmonary nodule of the left upper lobe has increased in size with a mean diameter of 3.7 mm. New solid pulmonary nodule the left upper lobe with a mean diameter of 4.0 mm. Lung-RADS 4A, suspicious.  Today CT FEB 17th, 2023-no evidence of any progressive disease noted.  Recommend follow-up imaging in 12 months.  # COPD on 3 Lit/[place]- STABLE [ PCP-reminded to use inhalers]; awaiting to start inhaler from PCP.    # Palpitation- awaiting cards evaluation.   #Iron deficient anemia-Hb 12.2 [nov 2019]; colonoscopy/EGD- [facility] [[facility] CT NOV 2019- neg. Recommend p.o. iron once a day.  #Incidental findings on CT Imaging dated: February 2023 three-vessel coronary calcification/arteriosclerosis/emphysema I reviewed/discussed/counseled the patient.   # DISPOSITION:  # follow up TBD- Dr.B  CC:  Dr.Tolbert; [place].  # I reviewed the blood work- with the patient in detail; also reviewed the imaging independently [as summarized above]; and with the patient in detail.  Addendum: I reviewed the results of the CT scan with the patient in detail.  Recommend follow-up in 12 months; CT/MD visit labs same day.

## 2021-04-23 ENCOUNTER — Telehealth: Payer: Self-pay | Admitting: Internal Medicine

## 2021-04-23 NOTE — Telephone Encounter (Signed)
C-please schedule follow-up in 12 months; CTchest /MD visit labs- cbc/cmp- all on same day.   GB

## 2021-04-24 ENCOUNTER — Other Ambulatory Visit: Payer: Self-pay

## 2021-04-24 DIAGNOSIS — C3412 Malignant neoplasm of upper lobe, left bronchus or lung: Secondary | ICD-10-CM

## 2021-04-24 NOTE — Telephone Encounter (Signed)
Lab orders entered

## 2022-02-28 DIAGNOSIS — M65341 Trigger finger, right ring finger: Secondary | ICD-10-CM | POA: Insufficient documentation

## 2022-03-13 DIAGNOSIS — E1140 Type 2 diabetes mellitus with diabetic neuropathy, unspecified: Secondary | ICD-10-CM | POA: Insufficient documentation

## 2022-04-21 ENCOUNTER — Other Ambulatory Visit: Payer: Self-pay | Admitting: Internal Medicine

## 2022-04-21 ENCOUNTER — Ambulatory Visit
Admission: RE | Admit: 2022-04-21 | Discharge: 2022-04-21 | Disposition: A | Discharge status: Home / Self Care | Payer: Medicare Other | Source: Ambulatory Visit | Attending: Internal Medicine | Admitting: Internal Medicine

## 2022-04-21 ENCOUNTER — Other Ambulatory Visit: Payer: Self-pay

## 2022-04-21 ENCOUNTER — Inpatient Hospital Stay: Payer: Medicare Other | Attending: Internal Medicine

## 2022-04-21 ENCOUNTER — Inpatient Hospital Stay: Payer: Medicare Other | Admitting: Internal Medicine

## 2022-04-21 VITALS — BP 99/61 | HR 93 | Resp 18 | Ht 62.0 in | Wt 151.0 lb

## 2022-04-21 DIAGNOSIS — Z902 Acquired absence of lung [part of]: Secondary | ICD-10-CM | POA: Insufficient documentation

## 2022-04-21 DIAGNOSIS — Z85118 Personal history of other malignant neoplasm of bronchus and lung: Secondary | ICD-10-CM | POA: Insufficient documentation

## 2022-04-21 DIAGNOSIS — Z9221 Personal history of antineoplastic chemotherapy: Secondary | ICD-10-CM | POA: Insufficient documentation

## 2022-04-21 DIAGNOSIS — C3412 Malignant neoplasm of upper lobe, left bronchus or lung: Secondary | ICD-10-CM | POA: Diagnosis not present

## 2022-04-21 DIAGNOSIS — J9610 Chronic respiratory failure, unspecified whether with hypoxia or hypercapnia: Secondary | ICD-10-CM | POA: Insufficient documentation

## 2022-04-21 DIAGNOSIS — Z9981 Dependence on supplemental oxygen: Secondary | ICD-10-CM | POA: Insufficient documentation

## 2022-04-21 DIAGNOSIS — Z87891 Personal history of nicotine dependence: Secondary | ICD-10-CM | POA: Insufficient documentation

## 2022-04-21 DIAGNOSIS — J449 Chronic obstructive pulmonary disease, unspecified: Secondary | ICD-10-CM | POA: Insufficient documentation

## 2022-04-21 DIAGNOSIS — Z79899 Other long term (current) drug therapy: Secondary | ICD-10-CM | POA: Insufficient documentation

## 2022-04-21 DIAGNOSIS — D509 Iron deficiency anemia, unspecified: Secondary | ICD-10-CM | POA: Diagnosis not present

## 2022-04-21 LAB — COMPREHENSIVE METABOLIC PANEL
ALT: 22 U/L (ref 0–44)
AST: 24 U/L (ref 15–41)
Albumin: 3.8 g/dL (ref 3.5–5.0)
Alkaline Phosphatase: 57 U/L (ref 38–126)
Anion gap: 10 (ref 5–15)
BUN: 16 mg/dL (ref 8–23)
CO2: 34 mmol/L — ABNORMAL HIGH (ref 22–32)
Calcium: 9.1 mg/dL (ref 8.9–10.3)
Chloride: 81 mmol/L — ABNORMAL LOW (ref 98–111)
Creatinine, Ser: 1.04 mg/dL — ABNORMAL HIGH (ref 0.44–1.00)
GFR, Estimated: 57 mL/min — ABNORMAL LOW (ref >60)
Glucose, Bld: 124 mg/dL — ABNORMAL HIGH (ref 70–99)
Potassium: 4 mmol/L (ref 3.5–5.1)
Sodium: 125 mmol/L — ABNORMAL LOW (ref 135–145)
Total Bilirubin: 0.6 mg/dL (ref 0.3–1.2)
Total Protein: 7.3 g/dL (ref 6.5–8.1)

## 2022-04-21 LAB — CBC WITH DIFFERENTIAL/PLATELET
Abs Immature Granulocytes: 0.02 10*3/uL (ref 0.00–0.07)
Basophils Absolute: 0 10*3/uL (ref 0.0–0.1)
Basophils Relative: 0 %
Eosinophils Absolute: 0.2 10*3/uL (ref 0.0–0.5)
Eosinophils Relative: 3 %
HCT: 36.9 % (ref 36.0–46.0)
Hemoglobin: 11.6 g/dL — ABNORMAL LOW (ref 12.0–15.0)
Immature Granulocytes: 0 %
Lymphocytes Relative: 21 %
Lymphs Abs: 1.4 10*3/uL (ref 0.7–4.0)
MCH: 28.9 pg (ref 26.0–34.0)
MCHC: 31.4 g/dL (ref 30.0–36.0)
MCV: 91.8 fL (ref 80.0–100.0)
Monocytes Absolute: 0.7 10*3/uL (ref 0.1–1.0)
Monocytes Relative: 10 %
Neutro Abs: 4.5 10*3/uL (ref 1.7–7.7)
Neutrophils Relative %: 66 %
Platelets: 184 10*3/uL (ref 150–400)
RBC: 4.02 MIL/uL (ref 3.87–5.11)
RDW: 13.2 % (ref 11.5–15.5)
WBC: 6.8 10*3/uL (ref 4.0–10.5)
nRBC: 0 % (ref 0.0–0.2)

## 2022-04-21 NOTE — Progress Notes (Signed)
[facility] OFFICE PROGRESS NOTE  Patient Care Team: Kaitlyn Hipps, MD as PCP - General (Internal Medicine) Kaitlyn Sickle, MD as Consulting Physician (Hematology and Oncology)   Cancer Staging  No matching staging information was found for the patient.   Oncology History Overview Note    # 2012- [facility] LUNG CANCER- Adenocarcinoma status post resection 5 cm tumor negative lymph nodes (September of 2012);  T2 b , N0, M0 tumor stage II a; [Dr.Oaks] 2. Adjuvant chemotherapy with carboplatinum and Alimta started in November of 2012; last CT scan 2017- NED.   # quit smoking- AUG 2017.  --------------------------------------------------  DIAGNOSIS: Lung cancer  STAGE:    II     ;GOALS: cure  CURRENT/MOST RECENT THERAPY: surveillaince    Primary cancer of left upper lobe of lung ([name])     INTERVAL HISTORY: Kaitlyn Mcbride female patient.  Ambulating independently.  3 L of oxygen.  Kaitlyn Mcbride 72 y.o.  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  Patient had a CT scan this morning.  Patient continues her chronic shortness of breath.  Chronic cough.  Not any worse.  She continues to be on 3 L of oxygen.  No blood in stools or black or stools.  Patient admits to noncompliance with her iron tablets.  Review of Systems  Constitutional:  Positive for malaise/fatigue. Negative for chills, diaphoresis, fever and weight loss.  HENT:  Negative for nosebleeds and sore throat.   Eyes:  Negative for double vision.  Respiratory:  Positive for cough, sputum production and shortness of breath. Negative for hemoptysis and wheezing.   Cardiovascular:  Negative for chest pain, palpitations, orthopnea and leg swelling.  Gastrointestinal:  Negative for abdominal pain, blood in stool, constipation, diarrhea, heartburn,  melena, nausea and vomiting.  Genitourinary:  Negative for dysuria, frequency and urgency.  Musculoskeletal:  Negative for back pain and joint pain.  Skin: Negative.  Negative for itching and rash.  Neurological:  Positive for dizziness. Negative for tingling, focal weakness, weakness and headaches.  Endo/Heme/Allergies:  Does not bruise/bleed easily.  Psychiatric/Behavioral:  Negative for depression. The patient is not nervous/anxious and does not have insomnia.      PAST MEDICAL HISTORY :  Past Medical History:  Diagnosis Date   Cancer ([place])    Diabetes mellitus without complication ([place])    Dysphagia    Hypercholesteremia    Hypertension    Lung cancer ([place])    Migraine    Myocardial infarction ([place])     PAST SURGICAL HISTORY :   Past Surgical History:  Procedure Laterality Date   LUNG REMOVAL, PARTIAL Left    upper lobe   THORACOTOMY Left     FAMILY HISTORY :  No family history on file.  SOCIAL HISTORY:   Social History   Tobacco Use   Smoking status: Former    Packs/day: 0.75    Years: 60.00    Total pack years: 45.00    Types: Cigarettes    Quit date: 10/01/2017    Years since quitting: 4.5   Smokeless tobacco: Never  Substance Use Topics   Alcohol use: Not Currently   Drug use: No    ALLERGIES:  has No Known Allergies.  MEDICATIONS:  Current Outpatient Medications  Medication Sig Dispense Refill   acetaminophen (TYLENOL) 325 MG tablet Take 650 mg by mouth every 6 (  six) hours as needed.     albuterol (PROVENTIL) (5 MG/ML) 0.5% nebulizer solution Take by nebulization 3 (three) times daily.     amitriptyline (ELAVIL) 25 MG tablet Take 1 tablet by mouth at bedtime.     aspirin EC 81 MG tablet Take 1 tablet by mouth daily.     atorvastatin (LIPITOR) 10 MG tablet Take 10 mg by mouth.     cycloSPORINE (RESTASIS) 0.05 % ophthalmic emulsion 1 drop.     esomeprazole (NEXIUM) 40 MG capsule Take 40 mg by mouth daily at 12 noon.     furosemide (LASIX) 20 MG  tablet Take 20 mg by mouth. PRN     gabapentin (NEURONTIN) 600 MG tablet Take 600 mg by mouth 3 (three) times daily.     hydrochlorothiazide (HYDRODIURIL) 25 MG tablet Take 1 tablet by mouth daily.     latanoprost (XALATAN) 0.005 % ophthalmic solution 1 drop at bedtime.     lisinopril (PRINIVIL,ZESTRIL) 20 MG tablet Take 20 mg by mouth.     metoprolol succinate (TOPROL-XL) 50 MG 24 hr tablet Take 50 mg by mouth.     Multiple Vitamin (MULTI-VITAMIN) tablet Take 1 tablet by mouth every morning.     OXYGEN Inhale 3 L into the lungs.     Polyethyl Glycol-Propyl Glycol 0.4-0.3 % SOLN Place 2 drops into both eyes as needed.     polyethylene glycol powder (GLYCOLAX/MIRALAX) 17 GM/SCOOP powder Take 1 Container by mouth once.     TRELEGY ELLIPTA 100-62.5-25 MCG/INH AEPB Inhale 1 puff into the lungs daily.     albuterol (PROVENTIL HFA;VENTOLIN HFA) 108 (90 Base) MCG/ACT inhaler Inhale into the lungs. (Patient not taking: Reported on 04/21/2022)     Fluticasone-Salmeterol (ADVAIR) 250-50 MCG/DOSE AEPB Inhale 1 puff into the lungs 2 (two) times daily. (Patient not taking: Reported on 01/16/2021)     ketoconazole (NIZORAL) 2 % cream SMARTSIG:Sparingly Topical Daily (Patient not taking: Reported on 04/21/2022)     meclizine (ANTIVERT) 12.5 MG tablet Take 1 tablet (12.5 mg total) by mouth 3 (three) times daily as needed for dizziness. (Patient not taking: Reported on 01/16/2021) 45 tablet 1   meloxicam (MOBIC) 15 MG tablet Take by mouth. (Patient not taking: Reported on 01/16/2021)     nitroGLYCERIN (NITROSTAT) 0.4 MG SL tablet Place 0.4 mg under the tongue every 5 (five) minutes as needed for chest pain. (Patient not taking: Reported on 01/17/2020)     oxyCODONE (OXY IR/ROXICODONE) 5 MG immediate release tablet Take 5 mg by mouth. (Patient not taking: Reported on 01/16/2021)     pregabalin (LYRICA) 50 MG capsule Take 1 capsule by mouth 3 (three) times daily. (Patient not taking: Reported on 01/16/2021)      rOPINIRole (REQUIP) 2 MG tablet Take 2 mg by mouth at bedtime. (Patient not taking: Reported on 04/21/2022)     sertraline (ZOLOFT) 100 MG tablet Take 100 mg by mouth daily. (Patient not taking: Reported on 04/21/2022)     simvastatin (ZOCOR) 20 MG tablet Take 20 mg by mouth. (Patient not taking: Reported on 01/16/2021)     tiotropium (SPIRIVA) 18 MCG inhalation capsule Place 18 mcg into inhaler and inhale daily. (Patient not taking: Reported on 01/16/2021)     No current facility-administered medications for this visit.    PHYSICAL EXAMINATION: ECOG PERFORMANCE STATUS: 0 - Asymptomatic  BP 99/61 (BP Location: Right Arm, Patient Position: Sitting)   Pulse 93   Resp 18   Ht 5\' 2"  (1.575 m)   Wt  151 lb (68.5 kg)   SpO2 97%   BMI 27.62 kg/m   Filed Weights   04/21/22 1456  Weight: 151 lb (68.5 kg)    Physical Exam Constitutional:      Comments: Nasal cannula oxygen.  HENT:     Head: Normocephalic and atraumatic.     Mouth/Throat:     Pharynx: No oropharyngeal exudate.  Eyes:     Pupils: Pupils are equal, round, and reactive to light.  Cardiovascular:     Rate and Rhythm: Normal rate and regular rhythm.  Pulmonary:     Effort: No respiratory distress.     Breath sounds: No wheezing.  Abdominal:     General: Bowel sounds are normal. There is no distension.     Palpations: Abdomen is soft. There is no mass.     Tenderness: There is no abdominal tenderness. There is no guarding or rebound.  Musculoskeletal:        General: No tenderness. Normal range of motion.     Cervical back: Normal range of motion and neck supple.  Skin:    General: Skin is warm.  Neurological:     Mental Status: She is alert and oriented to person, place, and time.  Psychiatric:        Mood and Affect: Affect normal.      LABORATORY DATA:  I have reviewed the data as listed    Component Value Date/Time   NA 125 (L) 04/21/2022 1022   NA 135 06/22/2014 0958   K 4.0 04/21/2022 1022   K 4.5  06/22/2014 0958   CL 81 (L) 04/21/2022 1022   CL 97 (L) 06/22/2014 0958   CO2 34 (H) 04/21/2022 1022   CO2 30 06/22/2014 0958   GLUCOSE 124 (H) 04/21/2022 1022   GLUCOSE 64 (L) 06/22/2014 0958   BUN 16 04/21/2022 1022   BUN 17 06/22/2014 0958   CREATININE 1.04 (H) 04/21/2022 1022   CREATININE 0.79 06/22/2014 0958   CALCIUM 9.1 04/21/2022 1022   CALCIUM 9.6 06/22/2014 0958   PROT 7.3 04/21/2022 1022   PROT 7.6 06/22/2014 0958   ALBUMIN 3.8 04/21/2022 1022   ALBUMIN 4.1 06/22/2014 0958   AST 24 04/21/2022 1022   AST 21 06/22/2014 0958   ALT 22 04/21/2022 1022   ALT 19 06/22/2014 0958   ALKPHOS 57 04/21/2022 1022   ALKPHOS 63 06/22/2014 0958   BILITOT 0.6 04/21/2022 1022   BILITOT 0.5 06/22/2014 0958   GFRNONAA 57 (L) 04/21/2022 1022   GFRNONAA >60 06/22/2014 0958   GFRAA >60 01/17/2019 1000   GFRAA >60 06/22/2014 0958    No results found for: "SPEP", "UPEP"  Lab Results  Component Value Date   WBC 6.8 04/21/2022   NEUTROABS 4.5 04/21/2022   HGB 11.6 (L) 04/21/2022   HCT 36.9 04/21/2022   MCV 91.8 04/21/2022   PLT 184 04/21/2022      Chemistry      Component Value Date/Time   NA 125 (L) 04/21/2022 1022   NA 135 06/22/2014 0958   K 4.0 04/21/2022 1022   K 4.5 06/22/2014 0958   CL 81 (L) 04/21/2022 1022   CL 97 (L) 06/22/2014 0958   CO2 34 (H) 04/21/2022 1022   CO2 30 06/22/2014 0958   BUN 16 04/21/2022 1022   BUN 17 06/22/2014 0958   CREATININE 1.04 (H) 04/21/2022 1022   CREATININE 0.79 06/22/2014 0958      Component Value Date/Time   CALCIUM 9.1 04/21/2022 1022  CALCIUM 9.6 06/22/2014 0958   ALKPHOS 57 04/21/2022 1022   ALKPHOS 63 06/22/2014 0958   AST 24 04/21/2022 1022   AST 21 06/22/2014 0958   ALT 22 04/21/2022 1022   ALT 19 06/22/2014 0958   BILITOT 0.6 04/21/2022 1022   BILITOT 0.5 06/22/2014 0958       RADIOGRAPHIC STUDIES: I have personally reviewed the radiological images as listed and agreed with the findings in the report. No  results found.    ASSESSMENT & PLAN:  Primary cancer of left upper lobe of lung ([place]) # Lung cancer s/p adjuvant chemo.  CT FEB 17th, 2023-no evidence of any progressive disease noted. CT scan- FEB 19th, 2024- pending.  Recommend follow-up imaging in 12 months.  # COPD on 3 Lit/[place]- stable [ PCP-reminded to use inhalers]; on inhalers-STABLE.   #Iron deficient anemia-Hb 11-12 [nov 2019]; colonoscopy/EGD- [facility] [[facility]]- CT NOV 2019- neg. Recommend p.o. iron once a day.  #Incidental findings on CT Imaging dated: February 2023 three-vessel coronary calcification/arteriosclerosis/emphysema I reviewed/discussed/counseled the patient.   # DISPOSITION:  # Follow-up in 12 months; CT/MD- labs- cbc/cmp; iron studies;ferritin; CT chest the same day of visit Dr.B  CC:  Dr.Tolbert; [place].  # I reviewed the blood work- with the patient in detail; also reviewed the imaging independently [as summarized above]; and with the patient in detail.     Orders Placed This Encounter  Procedures   CT CHEST NODULE FOLLOW UP WO CONTRAST    Standing Status:   Future    Standing Expiration Date:   04/22/2023    Order Specific Question:   Preferred imaging location?    Answer:   [facility]   CBC with Differential ([facility] Only)    Standing Status:   Future    Standing Expiration Date:   04/22/2023   CMP ([facility] only)    Standing Status:   Future    Standing Expiration Date:   04/22/2023   Iron and TIBC    Standing Status:   Future    Standing Expiration Date:   04/22/2023   Ferritin    Standing Status:   Future    Standing Expiration Date:   04/22/2023   All questions were answered. The patient knows to call the clinic with any problems, questions or concerns.      Kaitlyn Sickle, MD 04/21/2022 4:06 PM

## 2022-04-21 NOTE — Assessment & Plan Note (Addendum)
#   Lung cancer s/p adjuvant chemo.  CT FEB 17th, 2023-no evidence of any progressive disease noted. CT scan- FEB 19th, 2024- pending.  Recommend follow-up imaging in 12 months.  # COPD on 3 Lit/[place]- stable [ PCP-reminded to use inhalers]; on inhalers-STABLE.   #Iron deficient anemia-Hb 11-12 [nov 2019]; colonoscopy/EGD- 2019 [[facility]]- CT NOV 2019- neg. Recommend p.o. iron once a day.  #Incidental findings on CT Imaging dated: February 2023 three-vessel coronary calcification/arteriosclerosis/emphysema I reviewed/discussed/counseled the patient.   # DISPOSITION:  # Follow-up in 12 months; CT/MD- labs- cbc/cmp; iron studies;ferritin; CT chest the same day of visit Dr.B  CC:  Dr.Tolbert; [place].  # I reviewed the blood work- with the patient in detail; also reviewed the imaging independently [as summarized above]; and with the patient in detail.

## 2022-04-21 NOTE — Progress Notes (Signed)
Patient reports not sleeping well, patient thinks she has a UTI, she has burning with urination x 3 weeks with urinary incontinence, coughing up pinkish stuff,  neuropathy has gotten worse with some twitching, can't get her feet warm. She also reports burning in chest and stomach every time she eats. Patient reports feeling mildly lightheaded/dizzy BP 99/61, 91/63.

## 2022-04-22 ENCOUNTER — Telehealth: Payer: Self-pay | Admitting: *Deleted

## 2022-04-22 DIAGNOSIS — R911 Solitary pulmonary nodule: Secondary | ICD-10-CM

## 2022-04-22 NOTE — Telephone Encounter (Signed)
Per secure chat from Dr. Rogue Bussing: Kaitlyn Mcbride-please inform patient the results of the CT scan. Please reschedule the next CT scan to 3 months; rather than 12 months as ordered. Please have the patient follow-up 2 to 3 days AFTER the CT scan is done. In general patient prefers to follow-up with me on the same day of CT scan-which I would not recommend in 3 months.   Pt has been made aware of results and MD recommendations. Orders placed and message sent to scheduling to schedule for appts.

## 2022-04-22 NOTE — Telephone Encounter (Signed)
Called report  IMPRESSION: 1. Pulmonary nodules in the right lower lobe and left lower lobe both categorized as Lung-RADS 4A, suspicious. Follow up low-dose chest CT without contrast in 3 months (please use the following order, "CT CHEST LCS NODULE FOLLOW-UP W/O CM") is recommended. Alternatively, PET may be considered when there is a solid component 71mm or larger. 2. The "S" modifier above refers to potentially clinically significant non lung cancer related findings. Specifically, there is aortic atherosclerosis, in addition to left main and three-vessel coronary artery disease. Please note that although the presence of coronary artery calcium documents the presence of coronary artery disease, the severity of this disease and any potential stenosis cannot be assessed on this non-gated CT examination. Assessment for potential risk factor modification, dietary therapy or pharmacologic therapy may be warranted, if clinically indicated. 3. Mild diffuse bronchial wall thickening with severe centrilobular and paraseptal emphysema; imaging findings suggestive of underlying COPD.   These results will be called to the ordering clinician or representative by the Radiologist Assistant, and communication documented in the PACS or [employer].   Aortic Atherosclerosis (ICD10-I70.0) and Emphysema (ICD10-J43.9).     Electronically Signed   By: Vinnie Langton M.D.   On: 04/22/2022 08:39

## 2022-07-21 ENCOUNTER — Inpatient Hospital Stay: Payer: Medicare Other | Attending: Internal Medicine | Admitting: Internal Medicine

## 2022-07-21 ENCOUNTER — Ambulatory Visit
Admission: RE | Admit: 2022-07-21 | Discharge: 2022-07-21 | Disposition: A | Discharge status: Home / Self Care | Payer: Medicare Other | Source: Ambulatory Visit | Attending: Internal Medicine | Admitting: Internal Medicine

## 2022-07-21 ENCOUNTER — Inpatient Hospital Stay: Payer: Medicare Other

## 2022-07-21 DIAGNOSIS — Z79899 Other long term (current) drug therapy: Secondary | ICD-10-CM | POA: Diagnosis not present

## 2022-07-21 DIAGNOSIS — Z9981 Dependence on supplemental oxygen: Secondary | ICD-10-CM | POA: Diagnosis not present

## 2022-07-21 DIAGNOSIS — Z87891 Personal history of nicotine dependence: Secondary | ICD-10-CM | POA: Diagnosis not present

## 2022-07-21 DIAGNOSIS — C3412 Malignant neoplasm of upper lobe, left bronchus or lung: Secondary | ICD-10-CM | POA: Diagnosis present

## 2022-07-21 DIAGNOSIS — J449 Chronic obstructive pulmonary disease, unspecified: Secondary | ICD-10-CM | POA: Insufficient documentation

## 2022-07-21 DIAGNOSIS — Z9221 Personal history of antineoplastic chemotherapy: Secondary | ICD-10-CM | POA: Insufficient documentation

## 2022-07-21 DIAGNOSIS — R918 Other nonspecific abnormal finding of lung field: Secondary | ICD-10-CM | POA: Diagnosis not present

## 2022-07-21 DIAGNOSIS — D509 Iron deficiency anemia, unspecified: Secondary | ICD-10-CM | POA: Diagnosis not present

## 2022-07-21 DIAGNOSIS — R911 Solitary pulmonary nodule: Secondary | ICD-10-CM | POA: Insufficient documentation

## 2022-07-21 LAB — CMP (CANCER CENTER ONLY)
ALT: 21 U/L (ref 0–44)
AST: 28 U/L (ref 15–41)
Albumin: 4.1 g/dL (ref 3.5–5.0)
Alkaline Phosphatase: 61 U/L (ref 38–126)
Anion gap: 13 (ref 5–15)
BUN: 12 mg/dL (ref 8–23)
CO2: 34 mmol/L — ABNORMAL HIGH (ref 22–32)
Calcium: 9.2 mg/dL (ref 8.9–10.3)
Chloride: 84 mmol/L — ABNORMAL LOW (ref 98–111)
Creatinine: 0.79 mg/dL (ref 0.44–1.00)
GFR, Estimated: >60 mL/min (ref >60)
Glucose, Bld: 138 mg/dL — ABNORMAL HIGH (ref 70–99)
Potassium: 4 mmol/L (ref 3.5–5.1)
Sodium: 131 mmol/L — ABNORMAL LOW (ref 135–145)
Total Bilirubin: 0.4 mg/dL (ref 0.3–1.2)
Total Protein: 7.8 g/dL (ref 6.5–8.1)

## 2022-07-21 LAB — CBC WITH DIFFERENTIAL (CANCER CENTER ONLY)
Abs Immature Granulocytes: 0.04 10*3/uL (ref 0.00–0.07)
Basophils Absolute: 0 10*3/uL (ref 0.0–0.1)
Basophils Relative: 1 %
Eosinophils Absolute: 0.2 10*3/uL (ref 0.0–0.5)
Eosinophils Relative: 2 %
HCT: 36.9 % (ref 36.0–46.0)
Hemoglobin: 11.6 g/dL — ABNORMAL LOW (ref 12.0–15.0)
Immature Granulocytes: 1 %
Lymphocytes Relative: 13 %
Lymphs Abs: 1.2 10*3/uL (ref 0.7–4.0)
MCH: 30.3 pg (ref 26.0–34.0)
MCHC: 31.4 g/dL (ref 30.0–36.0)
MCV: 96.3 fL (ref 80.0–100.0)
Monocytes Absolute: 0.8 10*3/uL (ref 0.1–1.0)
Monocytes Relative: 9 %
Neutro Abs: 6.7 10*3/uL (ref 1.7–7.7)
Neutrophils Relative %: 74 %
Platelet Count: 221 10*3/uL (ref 150–400)
RBC: 3.83 MIL/uL — ABNORMAL LOW (ref 3.87–5.11)
RDW: 13.7 % (ref 11.5–15.5)
WBC Count: 8.9 10*3/uL (ref 4.0–10.5)
nRBC: 0 % (ref 0.0–0.2)

## 2022-07-21 LAB — IRON AND TIBC
Iron: 105 ug/dL (ref 28–170)
Saturation Ratios: 26 % (ref 10.4–31.8)
TIBC: 412 ug/dL (ref 250–450)
UIBC: 307 ug/dL

## 2022-07-21 LAB — FERRITIN: Ferritin: 40 ng/mL (ref 11–307)

## 2022-07-21 NOTE — Assessment & Plan Note (Addendum)
#   Lung cancer s/p adjuvant chemo.  CT FEB 17th, 2023-no evidence of any progressive disease noted. CT scan- FEB 19th, 2024- RLL & LLL- 7mm lung nodules.  Jul 21, 2022/3 months follow-up CT scan results pending-   # COPD on 3 Lit/[place]- stable [ PCP-reminded to use inhalers]; on inhalers-S stable.   #Iron deficient anemia-Hb 11-12 [nov 2019]; [facility]oscopy/EGD- [facility] [[facility]]- CT NOV 2019- neg. Recommend p.o. iron every other day.  Will need to call pt with results of CT scan # DISPOSITION:  # follow up TBD- Dr.B  Addendum: Lung-RADS 4B, suspicious. New irregular nodule in the anterior right lower lobe adjacent to the major fissure. Follow-up low-dose lung cancer screening CT chest is suggested in 3 months. Will inform pt. Informed [place]-  CC:  Dr.Tolbert; [place].

## 2022-07-21 NOTE — Progress Notes (Signed)
[facility] OFFICE PROGRESS NOTE  Patient Care Team: Bridgett Larsson, MD as PCP - General (Internal Medicine) [name], MD as Consulting Physician (Hematology and Oncology)   Cancer Staging  No matching staging information was found for the patient.   Oncology History Overview Note    # 2012- [facility] LUNG CANCER- Adenocarcinoma status post resection 5 cm tumor negative lymph nodes (September of 2012);  T2 b , N0, M0 tumor stage II a; [Dr.Oaks] 2. Adjuvant chemotherapy with carboplatinum and Alimta started in November of 2012; last CT scan 2017- NED.   # quit smoking- AUG 2017.  --------------------------------------------------  DIAGNOSIS: Lung cancer  STAGE:    II     ;GOALS: cure  CURRENT/MOST RECENT THERAPY: surveillaince    Primary cancer of left upper lobe of lung (HCC)     INTERVAL HISTORY: Kaitlyn Mcbride female patient.  Ambulating independently.  3 L of oxygen.  Kaitlyn Mcbride 72 y.o.  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  Patient had a CT scan this morning.  Patient continues her chronic shortness of breath.  Chronic cough.  Not any worse.  She continues to be on 3 L of oxygen.  No blood in stools or black or stools.  Patient admits to noncompliance with her iron tablets.  Review of Systems  Constitutional:  Positive for malaise/fatigue. Negative for chills, diaphoresis, fever and weight loss.  HENT:  Negative for nosebleeds and sore throat.   Eyes:  Negative for double vision.  Respiratory:  Positive for cough, sputum production and shortness of breath. Negative for hemoptysis and wheezing.   Cardiovascular:  Negative for chest pain, palpitations, orthopnea and leg swelling.  Gastrointestinal:  Negative for abdominal pain, blood in stool, constipation, diarrhea, heartburn,  melena, nausea and vomiting.  Genitourinary:  Negative for dysuria, frequency and urgency.  Musculoskeletal:  Negative for back pain and joint pain.  Skin: Negative.  Negative for itching and rash.  Neurological:  Positive for dizziness. Negative for tingling, focal weakness, weakness and headaches.  Endo/Heme/Allergies:  Does not bruise/bleed easily.  Psychiatric/Behavioral:  Negative for depression. The patient is not nervous/anxious and does not have insomnia.      PAST MEDICAL HISTORY :  Past Medical History:  Diagnosis Date   Cancer (HCC)    Diabetes mellitus without complication (HCC)    Dysphagia    Hypercholesteremia    Hypertension    Lung cancer (HCC)    Migraine    Myocardial infarction (HCC)     PAST SURGICAL HISTORY :   Past Surgical History:  Procedure Laterality Date   LUNG REMOVAL, PARTIAL Left    upper lobe   THORACOTOMY Left     FAMILY HISTORY :  No family history on file.  SOCIAL HISTORY:   Social History   Tobacco Use   Smoking status: Former    Packs/day: 0.75    Years: 60.00    Additional pack years: 0.00    Total pack years: 45.00    Types: Cigarettes    Quit date: 10/01/2017    Years since quitting: 4.8   Smokeless tobacco: Never  Substance Use Topics   Alcohol use: Not Currently   Drug use: No    ALLERGIES:  has No Known Allergies.  MEDICATIONS:  Current Outpatient Medications  Medication Sig Dispense Refill   acetaminophen (TYLENOL) 325 MG tablet  Take 650 mg by mouth every 6 (six) hours as needed.     albuterol (PROVENTIL HFA;VENTOLIN HFA) 108 (90 Base) MCG/ACT inhaler Inhale into the lungs.     albuterol (PROVENTIL) (5 MG/ML) 0.5% nebulizer solution Take by nebulization 3 (three) times daily.     amitriptyline (ELAVIL) 25 MG tablet Take 1 tablet by mouth at bedtime.     aspirin EC 81 MG tablet Take 1 tablet by mouth daily.     atorvastatin (LIPITOR) 10 MG tablet Take 10 mg by mouth.     cycloSPORINE (RESTASIS) 0.05 % ophthalmic  emulsion 1 drop.     esomeprazole (NEXIUM) 40 MG capsule Take 40 mg by mouth daily at 12 noon.     furosemide (LASIX) 20 MG tablet Take 20 mg by mouth. PRN     gabapentin (NEURONTIN) 600 MG tablet Take 600 mg by mouth 3 (three) times daily.     hydrochlorothiazide (HYDRODIURIL) 25 MG tablet Take 1 tablet by mouth daily.     latanoprost (XALATAN) 0.005 % ophthalmic solution 1 drop at bedtime.     lisinopril (PRINIVIL,ZESTRIL) 20 MG tablet Take 20 mg by mouth.     metoprolol succinate (TOPROL-XL) 50 MG 24 hr tablet Take 50 mg by mouth.     Multiple Vitamin (MULTI-VITAMIN) tablet Take 1 tablet by mouth every morning.     nitroGLYCERIN (NITROSTAT) 0.4 MG SL tablet Place 0.4 mg under the tongue every 5 (five) minutes as needed for chest pain.     oxyCODONE (OXY IR/ROXICODONE) 5 MG immediate release tablet Take 5 mg by mouth.     OXYGEN Inhale 3 L into the lungs.     Polyethyl Glycol-Propyl Glycol 0.4-0.3 % SOLN Place 2 drops into both eyes as needed.     rOPINIRole (REQUIP) 2 MG tablet Take 2 mg by mouth at bedtime.     sertraline (ZOLOFT) 100 MG tablet Take 100 mg by mouth daily.     simvastatin (ZOCOR) 20 MG tablet Take 20 mg by mouth.     TRELEGY ELLIPTA 100-62.5-25 MCG/INH AEPB Inhale 1 puff into the lungs daily.     Fluticasone-Salmeterol (ADVAIR) 250-50 MCG/DOSE AEPB Inhale 1 puff into the lungs 2 (two) times daily. (Patient not taking: Reported on 07/21/2022)     ketoconazole (NIZORAL) 2 % cream SMARTSIG:Sparingly Topical Daily (Patient not taking: Reported on 04/21/2022)     meclizine (ANTIVERT) 12.5 MG tablet Take 1 tablet (12.5 mg total) by mouth 3 (three) times daily as needed for dizziness. (Patient not taking: Reported on 01/16/2021) 45 tablet 1   meloxicam (MOBIC) 15 MG tablet Take by mouth. (Patient not taking: Reported on 01/16/2021)     polyethylene glycol powder (GLYCOLAX/MIRALAX) 17 GM/SCOOP powder Take 1 Container by mouth once. (Patient not taking: Reported on 07/21/2022)      pregabalin (LYRICA) 50 MG capsule Take 1 capsule by mouth 3 (three) times daily. (Patient not taking: Reported on 07/21/2022)     tiotropium (SPIRIVA) 18 MCG inhalation capsule Place 18 mcg into inhaler and inhale daily. (Patient not taking: Reported on 01/16/2021)     No current facility-administered medications for this visit.    PHYSICAL EXAMINATION: ECOG PERFORMANCE STATUS: 0 - Asymptomatic  BP 120/72 (BP Location: Left Arm, Patient Position: Sitting, Cuff Size: Normal)   Pulse (!) 106   Temp 97.7 F (36.5 C) (Tympanic)   Wt 153 lb 3.2 oz (69.5 kg)   SpO2 92% Comment: 3 Liters of oxygen  BMI 28.02 kg/m   Filed  Weights   07/21/22 1502  Weight: 153 lb 3.2 oz (69.5 kg)    Physical Exam Constitutional:      Comments: Nasal cannula oxygen.  HENT:     Head: Normocephalic and atraumatic.     Mouth/Throat:     Pharynx: No oropharyngeal exudate.  Eyes:     Pupils: Pupils are equal, round, and reactive to light.  Cardiovascular:     Rate and Rhythm: Normal rate and regular rhythm.  Pulmonary:     Effort: No respiratory distress.     Breath sounds: No wheezing.  Abdominal:     General: Bowel sounds are normal. There is no distension.     Palpations: Abdomen is soft. There is no mass.     Tenderness: There is no abdominal tenderness. There is no guarding or rebound.  Musculoskeletal:        General: No tenderness. Normal range of motion.     Cervical back: Normal range of motion and neck supple.  Skin:    General: Skin is warm.  Neurological:     Mental Status: She is alert and oriented to person, place, and time.  Psychiatric:        Mood and Affect: Affect normal.      LABORATORY DATA:  I have reviewed the data as listed    Component Value Date/Time   NA 131 (L) 07/21/2022 1436   NA 135 06/22/2014 0958   K 4.0 07/21/2022 1436   K 4.5 06/22/2014 0958   CL 84 (L) 07/21/2022 1436   CL 97 (L) 06/22/2014 0958   CO2 34 (H) 07/21/2022 1436   CO2 30 06/22/2014 0958    GLUCOSE 138 (H) 07/21/2022 1436   GLUCOSE 64 (L) 06/22/2014 0958   BUN 12 07/21/2022 1436   BUN 17 06/22/2014 0958   CREATININE 0.79 07/21/2022 1436   CREATININE 0.79 06/22/2014 0958   CALCIUM 9.2 07/21/2022 1436   CALCIUM 9.6 06/22/2014 0958   PROT 7.8 07/21/2022 1436   PROT 7.6 06/22/2014 0958   ALBUMIN 4.1 07/21/2022 1436   ALBUMIN 4.1 06/22/2014 0958   AST 28 07/21/2022 1436   ALT 21 07/21/2022 1436   ALT 19 06/22/2014 0958   ALKPHOS 61 07/21/2022 1436   ALKPHOS 63 06/22/2014 0958   BILITOT 0.4 07/21/2022 1436   GFRNONAA >60 07/21/2022 1436   GFRNONAA >60 06/22/2014 0958   GFRAA >60 01/17/2019 1000   GFRAA >60 06/22/2014 0958    No results found for: "SPEP", "UPEP"  Lab Results  Component Value Date   WBC 8.9 07/21/2022   NEUTROABS 6.7 07/21/2022   HGB 11.6 (L) 07/21/2022   HCT 36.9 07/21/2022   MCV 96.3 07/21/2022   PLT 221 07/21/2022      Chemistry      Component Value Date/Time   NA 131 (L) 07/21/2022 1436   NA 135 06/22/2014 0958   K 4.0 07/21/2022 1436   K 4.5 06/22/2014 0958   CL 84 (L) 07/21/2022 1436   CL 97 (L) 06/22/2014 0958   CO2 34 (H) 07/21/2022 1436   CO2 30 06/22/2014 0958   BUN 12 07/21/2022 1436   BUN 17 06/22/2014 0958   CREATININE 0.79 07/21/2022 1436   CREATININE 0.79 06/22/2014 0958      Component Value Date/Time   CALCIUM 9.2 07/21/2022 1436   CALCIUM 9.6 06/22/2014 0958   ALKPHOS 61 07/21/2022 1436   ALKPHOS 63 06/22/2014 0958   AST 28 07/21/2022 1436   ALT 21 07/21/2022 1436  ALT 19 06/22/2014 0958   BILITOT 0.4 07/21/2022 1436       RADIOGRAPHIC STUDIES: I have personally reviewed the radiological images as listed and agreed with the findings in the report. No results found.    ASSESSMENT & PLAN:  Primary cancer of left upper lobe of lung (HCC) # Lung cancer s/p adjuvant chemo.  CT FEB 17th, 2023-no evidence of any progressive disease noted. CT scan- FEB 19th, 2024- RLL & LLL- 7mm lung nodules.  Jul 21, 2022/3  months follow-up CT scan results pending-   # COPD on 3 Lit/[place]- stable [ PCP-reminded to use inhalers]; on inhalers-S stable.   #Iron deficient anemia-Hb 11-12 [nov 2019]; [facility]- [facility] [[facility] CT NOV 2019- neg. Recommend p.o. iron every other day.  Will need to call pt with results of CT scan # DISPOSITION:  # follow up TBD- Dr.B  CC:  Dr.Tolbert; [place].    No orders of the defined types were placed in this encounter.  All questions were answered. The patient knows to call the clinic with any problems, questions or concerns.      [name], MD 07/21/2022 3:35 PM

## 2022-07-25 ENCOUNTER — Telehealth: Payer: Self-pay | Admitting: *Deleted

## 2022-07-25 ENCOUNTER — Ambulatory Visit: Payer: Medicare Other | Admitting: Internal Medicine

## 2022-07-25 DIAGNOSIS — R911 Solitary pulmonary nodule: Secondary | ICD-10-CM

## 2022-07-25 NOTE — Telephone Encounter (Signed)
Called report  IMPRESSION: Lung-RADS 4B, suspicious. Additional imaging evaluation or consultation with Pulmonology or Thoracic Surgery recommended.   New irregular nodule in the anterior right lower lobe adjacent to the major fissure. Follow-up low-dose lung cancer screening CT chest is suggested in 3 months.   Aortic Atherosclerosis (ICD10-I70.0) and Emphysema (ICD10-J43.9).   These results will be called to the ordering clinician or representative by the Radiologist Assistant, and communication documented in the PACS or [employer].     Electronically Signed   By: Charline Bills M.D.   On: 07/25/2022 00:06

## 2022-07-29 NOTE — Addendum Note (Signed)
Addended by: Glory Buff on: 07/29/2022 08:03 AM   Modules accepted: Orders

## 2022-07-29 NOTE — Telephone Encounter (Signed)
Pt has been made aware of recent CT results and MD recommendation. Pt verbalized understanding and requested that CT scan and her follow up with Dr. B be scheduled on the same day due to long commute.   Orders placed, schedule message sent.

## 2022-09-30 ENCOUNTER — Telehealth: Payer: Self-pay | Admitting: Internal Medicine

## 2022-09-30 NOTE — Telephone Encounter (Signed)
Pt called and stated that she needs a CT the same day as her MD appt. There is no active request to schedule a CT, (her MD appt is in 2025)

## 2022-10-29 ENCOUNTER — Inpatient Hospital Stay: Payer: Medicare Other | Attending: Oncology

## 2022-10-29 ENCOUNTER — Ambulatory Visit
Admission: RE | Admit: 2022-10-29 | Discharge: 2022-10-29 | Disposition: A | Discharge status: Home / Self Care | Payer: Medicare Other | Source: Ambulatory Visit | Attending: Internal Medicine | Admitting: Internal Medicine

## 2022-10-29 ENCOUNTER — Inpatient Hospital Stay: Payer: Medicare Other | Admitting: Internal Medicine

## 2022-10-29 ENCOUNTER — Encounter: Payer: Self-pay | Admitting: Internal Medicine

## 2022-10-29 VITALS — BP 130/66 | HR 107 | Temp 97.9°F | Ht 62.0 in | Wt 156.0 lb

## 2022-10-29 DIAGNOSIS — C3412 Malignant neoplasm of upper lobe, left bronchus or lung: Secondary | ICD-10-CM | POA: Diagnosis present

## 2022-10-29 DIAGNOSIS — D509 Iron deficiency anemia, unspecified: Secondary | ICD-10-CM | POA: Insufficient documentation

## 2022-10-29 DIAGNOSIS — J449 Chronic obstructive pulmonary disease, unspecified: Secondary | ICD-10-CM | POA: Insufficient documentation

## 2022-10-29 DIAGNOSIS — R918 Other nonspecific abnormal finding of lung field: Secondary | ICD-10-CM | POA: Insufficient documentation

## 2022-10-29 DIAGNOSIS — R040 Epistaxis: Secondary | ICD-10-CM | POA: Diagnosis not present

## 2022-10-29 DIAGNOSIS — R911 Solitary pulmonary nodule: Secondary | ICD-10-CM | POA: Insufficient documentation

## 2022-10-29 NOTE — Progress Notes (Signed)
[facility] OFFICE PROGRESS NOTE  Patient Care Team: Bridgett Larsson, MD as PCP - General (Internal Medicine) [name], MD as Consulting Physician (Hematology and Oncology)   Cancer Staging  No matching staging information was found for the patient.    Oncology History Overview Note    # 2012- [facility]UL LUNG CANCER- Adenocarcinoma status post resection 5 cm tumor negative lymph nodes (September of 2012);  T2 b , N0, M0 tumor stage II a; [Dr.Oaks] 2. Adjuvant chemotherapy with carboplatinum and Alimta started in November of 2012; last CT scan 2017- NED.   # quit smoking- AUG 2017.  --------------------------------------------------  DIAGNOSIS: Lung cancer  STAGE:    II     ;GOALS: cure  CURRENT/MOST RECENT THERAPY: surveillaince    Primary cancer of left upper lobe of lung (HCC)     INTERVAL HISTORY: Arn Medal female patient.  Ambulating independently.  3 L of oxygen.  Temprance L Scarfone 72 y.o.  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  Patient had a CT scan this morning.  Patient continues her chronic shortness of breath.  Chronic cough.  Not any worse.  She continues to be on 3-4 L of oxygen.  Occasional chest pain.   No blood in stools or black or stools.  Patient admits to noncompliance with her iron tablets   C/o tightness in legs. C/o having nose bleeds, left nostril, x2 months. Had to call ems once to help get it stopped.   She states the new topamax is making her hallucinate. Having some trouble swallowing solids. Some nausea. Diarrhea x2 days.  Review of Systems  Constitutional:  Positive for malaise/fatigue. Negative for chills, diaphoresis, fever and weight loss.  HENT:  Negative for nosebleeds and sore throat.   Eyes:  Negative for double vision.   Respiratory:  Positive for cough, sputum production and shortness of breath. Negative for hemoptysis and wheezing.   Cardiovascular:  Negative for chest pain, palpitations, orthopnea and leg swelling.  Gastrointestinal:  Negative for abdominal pain, blood in stool, constipation, diarrhea, heartburn, melena, nausea and vomiting.  Genitourinary:  Negative for dysuria, frequency and urgency.  Musculoskeletal:  Negative for back pain and joint pain.  Skin: Negative.  Negative for itching and rash.  Neurological:  Positive for dizziness. Negative for tingling, focal weakness, weakness and headaches.  Endo/Heme/Allergies:  Does not bruise/bleed easily.  Psychiatric/Behavioral:  Negative for depression. The patient is not nervous/anxious and does not have insomnia.      PAST MEDICAL HISTORY :  Past Medical History:  Diagnosis Date   Cancer (HCC)    Diabetes mellitus without complication (HCC)    Dysphagia    Hypercholesteremia    Hypertension    Lung cancer (HCC)    Migraine    Myocardial infarction (HCC)     PAST SURGICAL HISTORY :   Past Surgical History:  Procedure Laterality Date   LUNG REMOVAL, PARTIAL Left    upper lobe   THORACOTOMY Left     FAMILY HISTORY :  History reviewed. No pertinent family history.  SOCIAL HISTORY:   Social History   Tobacco Use   Smoking status: Former    Current packs/day: 0.00    Average packs/day: 0.8 packs/day for 60.0 years (45.0 ttl pk-yrs)    Types: Cigarettes    Start date: 10/01/1957    Quit  date: 10/01/2017    Years since quitting: 5.0   Smokeless tobacco: Never  Substance Use Topics   Alcohol use: Not Currently   Drug use: No    ALLERGIES:  has No Known Allergies.  MEDICATIONS:  Current Outpatient Medications  Medication Sig Dispense Refill   topiramate (TOPAMAX) 25 MG tablet Take 25 mg by mouth daily.     acetaminophen (TYLENOL) 325 MG tablet Take 650 mg by mouth every 6 (six) hours as needed.     albuterol (PROVENTIL  HFA;VENTOLIN HFA) 108 (90 Base) MCG/ACT inhaler Inhale into the lungs.     albuterol (PROVENTIL) (5 MG/ML) 0.5% nebulizer solution Take by nebulization 3 (three) times daily.     amitriptyline (ELAVIL) 25 MG tablet Take 1 tablet by mouth at bedtime.     aspirin EC 81 MG tablet Take 1 tablet by mouth daily.     atorvastatin (LIPITOR) 10 MG tablet Take 10 mg by mouth.     cycloSPORINE (RESTASIS) 0.05 % ophthalmic emulsion 1 drop.     esomeprazole (NEXIUM) 40 MG capsule Take 40 mg by mouth daily at 12 noon.     Fluticasone-Salmeterol (ADVAIR) 250-50 MCG/DOSE AEPB Inhale 1 puff into the lungs 2 (two) times daily. (Patient not taking: Reported on 07/21/2022)     furosemide (LASIX) 20 MG tablet Take 20 mg by mouth. PRN     gabapentin (NEURONTIN) 600 MG tablet Take 600 mg by mouth 3 (three) times daily.     hydrochlorothiazide (HYDRODIURIL) 25 MG tablet Take 1 tablet by mouth daily.     ketoconazole (NIZORAL) 2 % cream SMARTSIG:Sparingly Topical Daily (Patient not taking: Reported on 04/21/2022)     latanoprost (XALATAN) 0.005 % ophthalmic solution 1 drop at bedtime.     lisinopril (PRINIVIL,ZESTRIL) 20 MG tablet Take 20 mg by mouth.     meclizine (ANTIVERT) 12.5 MG tablet Take 1 tablet (12.5 mg total) by mouth 3 (three) times daily as needed for dizziness. (Patient not taking: Reported on 01/16/2021) 45 tablet 1   meloxicam (MOBIC) 15 MG tablet Take by mouth. (Patient not taking: Reported on 01/16/2021)     metoprolol succinate (TOPROL-XL) 50 MG 24 hr tablet Take 50 mg by mouth.     Multiple Vitamin (MULTI-VITAMIN) tablet Take 1 tablet by mouth every morning.     nitroGLYCERIN (NITROSTAT) 0.4 MG SL tablet Place 0.4 mg under the tongue every 5 (five) minutes as needed for chest pain.     oxyCODONE (OXY IR/ROXICODONE) 5 MG immediate release tablet Take 5 mg by mouth.     OXYGEN Inhale 3 L into the lungs.     Polyethyl Glycol-Propyl Glycol 0.4-0.3 % SOLN Place 2 drops into both eyes as needed.      polyethylene glycol powder (GLYCOLAX/MIRALAX) 17 GM/SCOOP powder Take 1 Container by mouth once. (Patient not taking: Reported on 07/21/2022)     pregabalin (LYRICA) 50 MG capsule Take 1 capsule by mouth 3 (three) times daily. (Patient not taking: Reported on 07/21/2022)     rOPINIRole (REQUIP) 2 MG tablet Take 2 mg by mouth at bedtime.     sertraline (ZOLOFT) 100 MG tablet Take 100 mg by mouth daily.     simvastatin (ZOCOR) 20 MG tablet Take 20 mg by mouth.     tiotropium (SPIRIVA) 18 MCG inhalation capsule Place 18 mcg into inhaler and inhale daily. (Patient not taking: Reported on 01/16/2021)     TRELEGY ELLIPTA 100-62.5-25 MCG/INH AEPB Inhale 1 puff into the lungs daily.  No current facility-administered medications for this visit.    PHYSICAL EXAMINATION: ECOG PERFORMANCE STATUS: 0 - Asymptomatic  BP 130/66 (BP Location: Left Arm, Patient Position: Sitting, Cuff Size: Normal)   Pulse (!) 107   Temp 97.9 F (36.6 C) (Tympanic)   Ht 5\' 2"  (1.575 m)   Wt 156 lb (70.8 kg)   SpO2 93%   BMI 28.53 kg/m   Filed Weights   10/29/22 1428  Weight: 156 lb (70.8 kg)    Physical Exam Constitutional:      Comments: Nasal cannula oxygen.  HENT:     Head: Normocephalic and atraumatic.     Mouth/Throat:     Pharynx: No oropharyngeal exudate.  Eyes:     Pupils: Pupils are equal, round, and reactive to light.  Cardiovascular:     Rate and Rhythm: Normal rate and regular rhythm.  Pulmonary:     Effort: No respiratory distress.     Breath sounds: No wheezing.  Abdominal:     General: Bowel sounds are normal. There is no distension.     Palpations: Abdomen is soft. There is no mass.     Tenderness: There is no abdominal tenderness. There is no guarding or rebound.  Musculoskeletal:        General: No tenderness. Normal range of motion.     Cervical back: Normal range of motion and neck supple.  Skin:    General: Skin is warm.  Neurological:     Mental Status: She is alert and  oriented to person, place, and time.  Psychiatric:        Mood and Affect: Affect normal.      LABORATORY DATA:  I have reviewed the data as listed    Component Value Date/Time   NA 131 (L) 07/21/2022 1436   NA 135 06/22/2014 0958   K 4.0 07/21/2022 1436   K 4.5 06/22/2014 0958   CL 84 (L) 07/21/2022 1436   CL 97 (L) 06/22/2014 0958   CO2 34 (H) 07/21/2022 1436   CO2 30 06/22/2014 0958   GLUCOSE 138 (H) 07/21/2022 1436   GLUCOSE 64 (L) 06/22/2014 0958   BUN 12 07/21/2022 1436   BUN 17 06/22/2014 0958   CREATININE 0.79 07/21/2022 1436   CREATININE 0.79 06/22/2014 0958   CALCIUM 9.2 07/21/2022 1436   CALCIUM 9.6 06/22/2014 0958   PROT 7.8 07/21/2022 1436   PROT 7.6 06/22/2014 0958   ALBUMIN 4.1 07/21/2022 1436   ALBUMIN 4.1 06/22/2014 0958   AST 28 07/21/2022 1436   ALT 21 07/21/2022 1436   ALT 19 06/22/2014 0958   ALKPHOS 61 07/21/2022 1436   ALKPHOS 63 06/22/2014 0958   BILITOT 0.4 07/21/2022 1436   GFRNONAA >60 07/21/2022 1436   GFRNONAA >60 06/22/2014 0958   GFRAA >60 01/17/2019 1000   GFRAA >60 06/22/2014 0958    No results found for: "SPEP", "UPEP"  Lab Results  Component Value Date   WBC 8.9 07/21/2022   NEUTROABS 6.7 07/21/2022   HGB 11.6 (L) 07/21/2022   HCT 36.9 07/21/2022   MCV 96.3 07/21/2022   PLT 221 07/21/2022      Chemistry      Component Value Date/Time   NA 131 (L) 07/21/2022 1436   NA 135 06/22/2014 0958   K 4.0 07/21/2022 1436   K 4.5 06/22/2014 0958   CL 84 (L) 07/21/2022 1436   CL 97 (L) 06/22/2014 0958   CO2 34 (H) 07/21/2022 1436   CO2 30 06/22/2014 9147  BUN 12 07/21/2022 1436   BUN 17 06/22/2014 0958   CREATININE 0.79 07/21/2022 1436   CREATININE 0.79 06/22/2014 0958      Component Value Date/Time   CALCIUM 9.2 07/21/2022 1436   CALCIUM 9.6 06/22/2014 0958   ALKPHOS 61 07/21/2022 1436   ALKPHOS 63 06/22/2014 0958   AST 28 07/21/2022 1436   ALT 21 07/21/2022 1436   ALT 19 06/22/2014 0958   BILITOT 0.4 07/21/2022  1436       RADIOGRAPHIC STUDIES: I have personally reviewed the radiological images as listed and agreed with the findings in the report. No results found.    ASSESSMENT & PLAN:  Primary cancer of left upper lobe of lung (HCC) # Lung cancer s/p adjuvant chemo.  CT FEB 17th, 2023-no evidence of any progressive disease noted. CT scan- FEB 19th, 2024- RLL & LLL- 7mm lung nodules. MAY 2024-  Lung-RADS 4B, suspicious. New irregular nodule in the anterior right lower lobe adjacent to the major fissure. Follow-up low-dose lung cancer screening CT chest AUG 28th, 2024- pending-   # COPD on 3 Lit/[place]- stable [ PCP-reminded to use inhalers]; on inhalers-- stable.   # Epistaxis- on Oxygen; ? Dry weather- s/p vaseline- if not improved- defer to PCP re: ENT referral.   #Iron deficient anemia-Hb 11-12 [nov 2019]; [facility]oscopy/[facility]- [facility] [[facility] CT NOV 2019- neg. Recommend p.o. iron every other day. Stable.   Will need to call pt with results of CT scan # DISPOSITION:  # follow up TBD- Dr.B  CC:  Dr.Tolbert; [place].  No orders of the defined types were placed in this encounter.  All questions were answered. The patient knows to call the clinic with any problems, questions or concerns.      [name], MD 10/29/2022 4:10 PM

## 2022-10-29 NOTE — Progress Notes (Signed)
On O2, 4L.  Had CT scan this morning.  C/o tightness in legs.  Having nose bleeds, left nostril, x2 months. Had to call ems once to help get it stopped.  She states the new topamax is making her hallucinate.  Having some trouble swallowing solids. Some nausea.  Diarrhea x2 days.  Occasional chest pain.

## 2022-10-29 NOTE — Assessment & Plan Note (Addendum)
#   Lung cancer s/p adjuvant chemo.  CT FEB 17th, 2023-no evidence of any progressive disease noted. CT scan- FEB 19th, 2024- RLL & LLL- 7mm lung nodules. MAY 2024-  Lung-RADS 4B, suspicious. New irregular nodule in the anterior right lower lobe adjacent to the major fissure. Follow-up low-dose lung cancer screening CT chest AUG 28th, 2024- pending-   # COPD on 3 Lit/[place]- stable [ PCP-reminded to use inhalers]; on inhalers-- stable.   # Epistaxis- on Oxygen; ? Dry weather- s/p vaseline- if not improved- defer to PCP re: ENT referral.   #Iron deficient anemia-Hb 11-12 [nov 2019]; [facility]oscopy/EGD- 2019 [[facility] CT NOV 2019- neg. Recommend p.o. iron every other day. Stable.   Will need to call pt with results of CT scan # DISPOSITION:  # follow up TBD- Dr.B  CC:  Dr.Tolbert; [place].

## 2022-11-17 ENCOUNTER — Telehealth: Payer: Self-pay | Admitting: *Deleted

## 2022-11-17 DIAGNOSIS — R911 Solitary pulmonary nodule: Secondary | ICD-10-CM

## 2022-11-17 DIAGNOSIS — D509 Iron deficiency anemia, unspecified: Secondary | ICD-10-CM

## 2022-11-17 NOTE — Telephone Encounter (Signed)
Patient states she has been waiting for someone to call her re her CT scan done 8/28 and that she has not heard form anyone about it. Please return her call.  From 10/29/22 office note :   Will need to call pt with results of CT scan # DISPOSITION:  # follow up TBD- Dr.B  IMPRESSION: 1. Lung-RADS 3S, probably benign findings. Short-term follow-up in 6 months is recommended with repeat low-dose chest CT without contrast (please use the following order, "CT CHEST LCS NODULE FOLLOW-UP W/O CM"). 2. The "S" modifier above refers to potentially clinically significant non lung cancer related findings. Specifically, there is aortic atherosclerosis, in addition to left main and three-vessel coronary artery disease. Please note that although the presence of coronary artery calcium documents the presence of coronary artery disease, the severity of this disease and any potential stenosis cannot be assessed on this non-gated CT examination. Assessment for potential risk factor modification, dietary therapy or pharmacologic therapy may be warranted, if clinically indicated. 3. Mild diffuse bronchial wall thickening with moderate centrilobular and paraseptal emphysema; imaging findings suggestive of underlying COPD.   Aortic Atherosclerosis (ICD10-I70.0) and Emphysema (ICD10-J43.9).     Electronically Signed   By: Trudie Reed M.D.   On: 11/07/2022 07:34

## 2022-11-17 NOTE — Telephone Encounter (Signed)
Pt has been made aware of CT results and upcoming appts. Pt verbalized understanding.

## 2023-04-22 ENCOUNTER — Ambulatory Visit: Payer: Medicare Other | Admitting: Internal Medicine

## 2023-04-22 ENCOUNTER — Other Ambulatory Visit: Payer: Medicare Other

## 2023-05-15 ENCOUNTER — Inpatient Hospital Stay: Payer: Medicare Other | Admitting: Internal Medicine

## 2023-05-15 ENCOUNTER — Ambulatory Visit
Admission: RE | Admit: 2023-05-15 | Discharge: 2023-05-15 | Disposition: A | Discharge status: Home / Self Care | Payer: Medicare Other | Source: Ambulatory Visit | Attending: Internal Medicine | Admitting: Internal Medicine

## 2023-05-15 ENCOUNTER — Encounter: Payer: Self-pay | Admitting: Internal Medicine

## 2023-05-15 ENCOUNTER — Inpatient Hospital Stay: Payer: Medicare Other | Attending: Internal Medicine

## 2023-05-15 ENCOUNTER — Other Ambulatory Visit: Payer: Self-pay | Admitting: Internal Medicine

## 2023-05-15 VITALS — BP 148/83 | HR 103 | Temp 96.0°F | Resp 17 | Ht 62.0 in | Wt 147.7 lb

## 2023-05-15 DIAGNOSIS — Z87891 Personal history of nicotine dependence: Secondary | ICD-10-CM | POA: Insufficient documentation

## 2023-05-15 DIAGNOSIS — R911 Solitary pulmonary nodule: Secondary | ICD-10-CM

## 2023-05-15 DIAGNOSIS — C3412 Malignant neoplasm of upper lobe, left bronchus or lung: Secondary | ICD-10-CM | POA: Insufficient documentation

## 2023-05-15 DIAGNOSIS — Z79899 Other long term (current) drug therapy: Secondary | ICD-10-CM | POA: Insufficient documentation

## 2023-05-15 DIAGNOSIS — J449 Chronic obstructive pulmonary disease, unspecified: Secondary | ICD-10-CM | POA: Diagnosis not present

## 2023-05-15 DIAGNOSIS — Z9221 Personal history of antineoplastic chemotherapy: Secondary | ICD-10-CM | POA: Insufficient documentation

## 2023-05-15 DIAGNOSIS — D509 Iron deficiency anemia, unspecified: Secondary | ICD-10-CM | POA: Diagnosis not present

## 2023-05-15 DIAGNOSIS — Z9981 Dependence on supplemental oxygen: Secondary | ICD-10-CM | POA: Diagnosis not present

## 2023-05-15 LAB — CBC WITH DIFFERENTIAL (CANCER CENTER ONLY)
Abs Immature Granulocytes: 0.03 10*3/uL (ref 0.00–0.07)
Basophils Absolute: 0 10*3/uL (ref 0.0–0.1)
Basophils Relative: 0 %
Eosinophils Absolute: 0.1 10*3/uL (ref 0.0–0.5)
Eosinophils Relative: 1 %
HCT: 39.2 % (ref 36.0–46.0)
Hemoglobin: 11.9 g/dL — ABNORMAL LOW (ref 12.0–15.0)
Immature Granulocytes: 0 %
Lymphocytes Relative: 16 %
Lymphs Abs: 1.3 10*3/uL (ref 0.7–4.0)
MCH: 29.7 pg (ref 26.0–34.0)
MCHC: 30.4 g/dL (ref 30.0–36.0)
MCV: 97.8 fL (ref 80.0–100.0)
Monocytes Absolute: 0.5 10*3/uL (ref 0.1–1.0)
Monocytes Relative: 6 %
Neutro Abs: 6.2 10*3/uL (ref 1.7–7.7)
Neutrophils Relative %: 77 %
Platelet Count: 184 10*3/uL (ref 150–400)
RBC: 4.01 MIL/uL (ref 3.87–5.11)
RDW: 13.2 % (ref 11.5–15.5)
WBC Count: 8.2 10*3/uL (ref 4.0–10.5)
nRBC: 0 % (ref 0.0–0.2)

## 2023-05-15 LAB — CMP (CANCER CENTER ONLY)
ALT: 18 U/L (ref 0–44)
AST: 24 U/L (ref 15–41)
Albumin: 4 g/dL (ref 3.5–5.0)
Alkaline Phosphatase: 62 U/L (ref 38–126)
Anion gap: 11 (ref 5–15)
BUN: 10 mg/dL (ref 8–23)
CO2: 33 mmol/L — ABNORMAL HIGH (ref 22–32)
Calcium: 9.3 mg/dL (ref 8.9–10.3)
Chloride: 91 mmol/L — ABNORMAL LOW (ref 98–111)
Creatinine: 0.66 mg/dL (ref 0.44–1.00)
GFR, Estimated: >60 mL/min (ref >60)
Glucose, Bld: 158 mg/dL — ABNORMAL HIGH (ref 70–99)
Potassium: 4.1 mmol/L (ref 3.5–5.1)
Sodium: 135 mmol/L (ref 135–145)
Total Bilirubin: 0.4 mg/dL (ref 0.0–1.2)
Total Protein: 7.5 g/dL (ref 6.5–8.1)

## 2023-05-15 LAB — IRON AND TIBC
Iron: 53 ug/dL (ref 28–170)
Saturation Ratios: 13 % (ref 10.4–31.8)
TIBC: 412 ug/dL (ref 250–450)
UIBC: 359 ug/dL

## 2023-05-15 LAB — FERRITIN: Ferritin: 25 ng/mL (ref 11–307)

## 2023-05-15 NOTE — Progress Notes (Signed)
 Patient states ER doctor told her that she has a spot on one of her kidneys during her hospital stay at [facility] from 03/05/23-03/20/23. She is concerned about it, and would like your insight regarding this matter.  She also is complaining of having a bowel movements every time she goes to urinate and occasional chest pain.  Patient also states she's out of Trelegy Ellipta medication.

## 2023-05-15 NOTE — Assessment & Plan Note (Addendum)
#   Lung cancer s/p adjuvant chemo.  CT FEB 17th, 2023-no evidence of any progressive disease noted. CT scan- FEB 19th, 2024- RLL & LLL- 7mm lung nodules. MAY 2024-  Lung-RADS 4B, suspicious. New irregular nodule in the anterior right lower lobe adjacent to the major fissure. Follow-up low-dose lung cancer screening CT chest MARCH 2025- penidng, but personal review no evidence of any progression, infact resolution of the Right medial lung nodule.  She needs  # COPD on 3 Lit/[place]- stable [ PCP-reminded to use inhalers]; on inhalers-- stable.   # Epistaxis- on Oxygen; ? Dry weather- s/p vaseline- if not improved- defer to PCP re: ENT referral.   # JAN 2025- novanyt-1.5 cm left adrenal nodule possibly an adrenal adenoma - no further work.   #Iron deficient anemia-Hb 11-12 [nov 2019]; [facility]oscopy/[facility]- [facility] [[facility] CT NOV 2019- neg.HOLD off PO iron.- Stable.   # Sent message to [facility]/ staff regarding follow-up- with labs-CBC CMP iron check ferritin-is on recommendations from radiologist.  Will need to call pt with results of CT scan # DISPOSITION:  # follow up TBD- Dr.B  # I reviewed the blood work- with the patient in detail; also reviewed the imaging independently [as summarized above]; and with the patient in detail.    CC:  Dr.Tolbert; [place].

## 2023-05-15 NOTE — Progress Notes (Signed)
 [facility] OFFICE PROGRESS NOTE  Patient Care Team: Bridgett Larsson, MD as PCP - General (Internal Medicine) Kaitlyn Coder, MD as Consulting Physician (Hematology and Oncology)   Cancer Staging  No matching staging information was found for the patient.    Oncology History Overview Note    # 2012- [facility] LUNG CANCER- Adenocarcinoma status post resection 5 cm tumor negative lymph nodes (September of 2012);  T2 b , N0, M0 tumor stage II a; [Dr.Oaks] 2. Adjuvant chemotherapy with carboplatinum and Alimta started in November of 2012; last CT scan 2017- NED.   # quit smoking- AUG 2017.  --------------------------------------------------  DIAGNOSIS: Lung cancer  STAGE:    II     ;GOALS: cure  CURRENT/MOST RECENT THERAPY: surveillaince    Primary cancer of left upper lobe of lung (HCC)     INTERVAL HISTORY: [place] female patient.  Ambulating independently.  3 L of oxygen.  Kaitlyn Mcbride 73 y.o.  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  Patient had a CT scan this morning.  Patient was in [place]  in Jan 2025- for COPD exacerbation-BiPAP/ICU stay.  Patient did not need to have mechanical ventilation.   During her stay at [facility], Patient states  doctor told her that she has a "spot" on one of her kidneys during her hospital stay at [facility] from 03/05/23-03/20/23.   Patient continues to have chronic shortness of breath chronic intermittent chest discomfort.  Otherwise denies any blood in symptoms episodes.  She is currently on 2 to 3 L of oxygen.   She also is complaining of having a bowel movements every time she goes to urinate and occasional chest pain.  Review of Systems  Constitutional:  Positive for malaise/fatigue. Negative for chills, diaphoresis, fever and  weight loss.  HENT:  Negative for nosebleeds and sore throat.   Eyes:  Negative for double vision.  Respiratory:  Positive for cough, sputum production and shortness of breath. Negative for hemoptysis and wheezing.   Cardiovascular:  Negative for chest pain, palpitations, orthopnea and leg swelling.  Gastrointestinal:  Negative for abdominal pain, blood in stool, constipation, diarrhea, heartburn, melena, nausea and vomiting.  Genitourinary:  Negative for dysuria, frequency and urgency.  Musculoskeletal:  Negative for back pain and joint pain.  Skin: Negative.  Negative for itching and rash.  Neurological:  Positive for dizziness. Negative for tingling, focal weakness, weakness and headaches.  Endo/Heme/Allergies:  Does not bruise/bleed easily.  Psychiatric/Behavioral:  Negative for depression. The patient is not nervous/anxious and does not have insomnia.      PAST MEDICAL HISTORY :  Past Medical History:  Diagnosis Date   Cancer (HCC)    Diabetes mellitus without complication (HCC)    Dysphagia    Hypercholesteremia    Hypertension    Lung cancer (HCC)    Migraine    Myocardial infarction (HCC)     PAST SURGICAL HISTORY :   Past Surgical History:  Procedure Laterality Date   LUNG REMOVAL, PARTIAL Left    upper lobe   THORACOTOMY Left     FAMILY HISTORY :  History reviewed. No pertinent family history.  SOCIAL HISTORY:   Social History   Tobacco Use   Smoking status: Former    Current packs/day: 0.00    Average packs/day: 0.8 packs/day for 60.0 years (45.0 ttl pk-yrs)  Types: Cigarettes    Start date: 10/01/1957    Quit date: 10/01/2017    Years since quitting: 5.6   Smokeless tobacco: Never  Substance Use Topics   Alcohol use: Not Currently   Drug use: No    ALLERGIES:  has no known allergies.  MEDICATIONS:  Current Outpatient Medications  Medication Sig Dispense Refill   acetaminophen (TYLENOL) 325 MG tablet Take 650 mg by mouth every 6 (six) hours as  needed.     albuterol (PROVENTIL HFA;VENTOLIN HFA) 108 (90 Base) MCG/ACT inhaler Inhale into the lungs.     albuterol (PROVENTIL) (5 MG/ML) 0.5% nebulizer solution Take by nebulization 3 (three) times daily.     amitriptyline (ELAVIL) 25 MG tablet Take 1 tablet by mouth at bedtime.     aspirin EC 81 MG tablet Take 1 tablet by mouth daily.     atorvastatin (LIPITOR) 10 MG tablet Take 10 mg by mouth.     cycloSPORINE (RESTASIS) 0.05 % ophthalmic emulsion 1 drop.     esomeprazole (NEXIUM) 40 MG capsule Take 40 mg by mouth daily at 12 noon.     Fluticasone-Salmeterol (ADVAIR) 250-50 MCG/DOSE AEPB Inhale 1 puff into the lungs 2 (two) times daily. (Patient not taking: Reported on 07/21/2022)     furosemide (LASIX) 20 MG tablet Take 20 mg by mouth. PRN     gabapentin (NEURONTIN) 600 MG tablet Take 600 mg by mouth 3 (three) times daily.     hydrochlorothiazide (HYDRODIURIL) 25 MG tablet Take 1 tablet by mouth daily.     ketoconazole (NIZORAL) 2 % cream SMARTSIG:Sparingly Topical Daily (Patient not taking: Reported on 04/21/2022)     latanoprost (XALATAN) 0.005 % ophthalmic solution 1 drop at bedtime.     lisinopril (PRINIVIL,ZESTRIL) 20 MG tablet Take 20 mg by mouth.     meclizine (ANTIVERT) 12.5 MG tablet Take 1 tablet (12.5 mg total) by mouth 3 (three) times daily as needed for dizziness. (Patient not taking: Reported on 01/16/2021) 45 tablet 1   meloxicam (MOBIC) 15 MG tablet Take by mouth. (Patient not taking: Reported on 01/16/2021)     metoprolol succinate (TOPROL-XL) 50 MG 24 hr tablet Take 50 mg by mouth.     Multiple Vitamin (MULTI-VITAMIN) tablet Take 1 tablet by mouth every morning.     nitroGLYCERIN (NITROSTAT) 0.4 MG SL tablet Place 0.4 mg under the tongue every 5 (five) minutes as needed for chest pain.     oxyCODONE (OXY IR/ROXICODONE) 5 MG immediate release tablet Take 5 mg by mouth.     OXYGEN Inhale 3 L into the lungs.     Polyethyl Glycol-Propyl Glycol 0.4-0.3 % SOLN Place 2 drops into  both eyes as needed.     polyethylene glycol powder (GLYCOLAX/MIRALAX) 17 GM/SCOOP powder Take 1 Container by mouth once. (Patient not taking: Reported on 07/21/2022)     pregabalin (LYRICA) 50 MG capsule Take 1 capsule by mouth 3 (three) times daily. (Patient not taking: Reported on 07/21/2022)     rOPINIRole (REQUIP) 2 MG tablet Take 2 mg by mouth at bedtime.     sertraline (ZOLOFT) 100 MG tablet Take 100 mg by mouth daily.     simvastatin (ZOCOR) 20 MG tablet Take 20 mg by mouth.     tiotropium (SPIRIVA) 18 MCG inhalation capsule Place 18 mcg into inhaler and inhale daily. (Patient not taking: Reported on 01/16/2021)     topiramate (TOPAMAX) 25 MG tablet Take 25 mg by mouth daily.     TRELEGY ELLIPTA  100-62.5-25 MCG/INH AEPB Inhale 1 puff into the lungs daily.     No current facility-administered medications for this visit.    PHYSICAL EXAMINATION: ECOG PERFORMANCE STATUS: 0 - Asymptomatic  BP (!) 148/83 (BP Location: Left Arm, Patient Position: Sitting, Cuff Size: Normal)   Pulse (!) 103   Temp (!) 96 F (35.6 C) (Tympanic)   Resp 17   Ht 5\' 2"  (1.575 m)   Wt 147 lb 11.2 oz (67 kg)   SpO2 98%   BMI 27.01 kg/m   Filed Weights   05/15/23 1429  Weight: 147 lb 11.2 oz (67 kg)    Physical Exam Constitutional:      Comments: Nasal cannula oxygen.  HENT:     Head: Normocephalic and atraumatic.     Mouth/Throat:     Pharynx: No oropharyngeal exudate.  Eyes:     Pupils: Pupils are equal, round, and reactive to light.  Cardiovascular:     Rate and Rhythm: Normal rate and regular rhythm.  Pulmonary:     Effort: No respiratory distress.     Breath sounds: No wheezing.  Abdominal:     General: Bowel sounds are normal. There is no distension.     Palpations: Abdomen is soft. There is no mass.     Tenderness: There is no abdominal tenderness. There is no guarding or rebound.  Musculoskeletal:        General: No tenderness. Normal range of motion.     Cervical back: Normal range  of motion and neck supple.  Skin:    General: Skin is warm.  Neurological:     Mental Status: She is alert and oriented to person, place, and time.  Psychiatric:        Mood and Affect: Affect normal.      LABORATORY DATA:  I have reviewed the data as listed    Component Value Date/Time   NA 135 05/15/2023 1417   NA 135 06/22/2014 0958   K 4.1 05/15/2023 1417   K 4.5 06/22/2014 0958   CL 91 (L) 05/15/2023 1417   CL 97 (L) 06/22/2014 0958   CO2 33 (H) 05/15/2023 1417   CO2 30 06/22/2014 0958   GLUCOSE 158 (H) 05/15/2023 1417   GLUCOSE 64 (L) 06/22/2014 0958   BUN 10 05/15/2023 1417   BUN 17 06/22/2014 0958   CREATININE 0.66 05/15/2023 1417   CREATININE 0.79 06/22/2014 0958   CALCIUM 9.3 05/15/2023 1417   CALCIUM 9.6 06/22/2014 0958   PROT 7.5 05/15/2023 1417   PROT 7.6 06/22/2014 0958   ALBUMIN 4.0 05/15/2023 1417   ALBUMIN 4.1 06/22/2014 0958   AST 24 05/15/2023 1417   ALT 18 05/15/2023 1417   ALT 19 06/22/2014 0958   ALKPHOS 62 05/15/2023 1417   ALKPHOS 63 06/22/2014 0958   BILITOT 0.4 05/15/2023 1417   GFRNONAA >60 05/15/2023 1417   GFRNONAA >60 06/22/2014 0958   GFRAA >60 01/17/2019 1000   GFRAA >60 06/22/2014 0958    No results found for: "SPEP", "UPEP"  Lab Results  Component Value Date   WBC 8.2 05/15/2023   NEUTROABS 6.2 05/15/2023   HGB 11.9 (L) 05/15/2023   HCT 39.2 05/15/2023   MCV 97.8 05/15/2023   PLT 184 05/15/2023      Chemistry      Component Value Date/Time   NA 135 05/15/2023 1417   NA 135 06/22/2014 0958   K 4.1 05/15/2023 1417   K 4.5 06/22/2014 0958   CL 91 (L)  05/15/2023 1417   CL 97 (L) 06/22/2014 0958   CO2 33 (H) 05/15/2023 1417   CO2 30 06/22/2014 0958   BUN 10 05/15/2023 1417   BUN 17 06/22/2014 0958   CREATININE 0.66 05/15/2023 1417   CREATININE 0.79 06/22/2014 0958      Component Value Date/Time   CALCIUM 9.3 05/15/2023 1417   CALCIUM 9.6 06/22/2014 0958   ALKPHOS 62 05/15/2023 1417   ALKPHOS 63 06/22/2014 0958    AST 24 05/15/2023 1417   ALT 18 05/15/2023 1417   ALT 19 06/22/2014 0958   BILITOT 0.4 05/15/2023 1417       RADIOGRAPHIC STUDIES: I have personally reviewed the radiological images as listed and agreed with the findings in the report. No results found.    ASSESSMENT & PLAN:  Primary cancer of left upper lobe of lung (HCC) # Lung cancer s/p adjuvant chemo.  CT FEB 17th, 2023-no evidence of any progressive disease noted. CT scan- FEB 19th, 2024- RLL & LLL- 7mm lung nodules. MAY 2024-  Lung-RADS 4B, suspicious. New irregular nodule in the anterior right lower lobe adjacent to the major fissure. Follow-up low-dose lung cancer screening CT chest MARCH 2025- penidng, but personal review no evidence of any progression, infact resolution of the Right medial lung nodule.  She needs  # COPD on 3 Lit/[place]- stable [ PCP-reminded to use inhalers]; on inhalers-- stable.   # Epistaxis- on Oxygen; ? Dry weather- s/p vaseline- if not improved- defer to PCP re: ENT referral.   # JAN 2025- novanyt-1.5 cm left adrenal nodule possibly an adrenal adenoma - no further work.   #Iron deficient anemia-Hb 11-12 [nov 2019]; [facility]- [facility] [[facility] CT NOV 2019- neg.HOLD off PO iron.- Stable.   # Sent message to [facility]/ staff regarding follow-up- with labs-CBC CMP iron check ferritin-is on recommendations from radiologist.  Will need to call pt with results of CT scan # DISPOSITION:  # follow up TBD- Dr.B  # I reviewed the blood work- with the patient in detail; also reviewed the imaging independently [as summarized above]; and with the patient in detail.    CC:  Dr.Tolbert; [place].  No orders of the defined types were placed in this encounter.  All questions were answered. The patient knows to call the clinic with any problems, questions or concerns.      Kaitlyn Coder, MD 05/15/2023 2:52 PM

## 2023-05-18 ENCOUNTER — Other Ambulatory Visit: Payer: Self-pay | Admitting: *Deleted

## 2023-05-18 DIAGNOSIS — D509 Iron deficiency anemia, unspecified: Secondary | ICD-10-CM

## 2023-05-18 DIAGNOSIS — C3412 Malignant neoplasm of upper lobe, left bronchus or lung: Secondary | ICD-10-CM

## 2023-05-27 ENCOUNTER — Telehealth: Payer: Self-pay | Admitting: *Deleted

## 2023-05-27 DIAGNOSIS — R911 Solitary pulmonary nodule: Secondary | ICD-10-CM

## 2023-05-27 NOTE — Telephone Encounter (Signed)
 I told her that radiology is on a back order to read the results. I has been  taking up to 16 days. She would like  telephone to go over when the results are read. She does not have any future appts.

## 2023-06-12 ENCOUNTER — Telehealth: Payer: Self-pay | Admitting: *Deleted

## 2023-06-12 DIAGNOSIS — D509 Iron deficiency anemia, unspecified: Secondary | ICD-10-CM

## 2023-06-12 NOTE — Telephone Encounter (Signed)
-----   Message from [name] sent at 05/15/2023  2:45 PM EDT ----- Regarding: follow up Kaitlyn Mcbride-please call the patient when the results of the CT scan are available; and as per the recommendations of the radiologist schedule CT/and  follow-up-  At that  appointment-with MD; labs-CBC BMP iron studies ferritin-  Thanks GB

## 2023-06-12 NOTE — Telephone Encounter (Signed)
 Per recent CT results, pt will need follow up imaging in 12 months from last CT. Orders placed. Message left with patient to call back to review results and schedule appts. Per scheduling, pt needs to call [phone] to schedule her CT scan. Pt will follow up with Dr. B with labs on the same day as imaging. Awaiting call back from pt to schedule appts.

## 2023-06-15 ENCOUNTER — Telehealth: Payer: Self-pay | Admitting: *Deleted

## 2023-06-15 NOTE — Telephone Encounter (Signed)
 The pt. Would like you to call her about her ct scan

## 2023-06-15 NOTE — Telephone Encounter (Signed)
 Kaitlyn Mcbride has been notified of her CT results with recommendation for CT in 1 year.   Per Kaitlyn Mcbride's note, patient will need to call and schedule her CT scan for on or after 05/14/24.   Phone number [phone] given to her to call.   After the CT scan is scheduled, she will call the cancer center back to schedule her appt. With Dr. Geraldene Kleine. On the same day as her scan.

## 2024-05-16 ENCOUNTER — Other Ambulatory Visit

## 2024-05-16 ENCOUNTER — Ambulatory Visit: Admitting: Internal Medicine

## 2024-05-16 ENCOUNTER — Encounter
